# Patient Record
Sex: Female | Born: 1960 | Race: White | Hispanic: No | Smoking: Never smoker
Health system: Southern US, Community
[De-identification: ages and names within clinical notes are randomized; demographics above are authoritative.]

## PROBLEM LIST (undated history)

## (undated) HISTORY — PX: BUNIONECTOMY: SHX129

---

## 1998-02-02 ENCOUNTER — Inpatient Hospital Stay (HOSPITAL_COMMUNITY): Admission: AD | Admit: 1998-02-02 | Discharge: 1998-02-02 | Payer: Self-pay | Admitting: *Deleted

## 1998-04-28 ENCOUNTER — Inpatient Hospital Stay (HOSPITAL_COMMUNITY): Admission: AD | Admit: 1998-04-28 | Discharge: 1998-04-28 | Payer: Self-pay | Admitting: Obstetrics and Gynecology

## 1998-04-30 ENCOUNTER — Inpatient Hospital Stay (HOSPITAL_COMMUNITY): Admission: AD | Admit: 1998-04-30 | Discharge: 1998-05-02 | Payer: Self-pay | Admitting: Obstetrics and Gynecology

## 1998-12-09 ENCOUNTER — Encounter (HOSPITAL_COMMUNITY): Admission: RE | Admit: 1998-12-09 | Discharge: 1999-03-09 | Payer: Self-pay | Admitting: Obstetrics and Gynecology

## 1999-03-12 ENCOUNTER — Encounter (HOSPITAL_COMMUNITY): Admission: RE | Admit: 1999-03-12 | Discharge: 1999-06-10 | Payer: Self-pay | Admitting: Obstetrics and Gynecology

## 2000-08-22 ENCOUNTER — Other Ambulatory Visit: Admission: RE | Admit: 2000-08-22 | Discharge: 2000-08-22 | Payer: Self-pay | Admitting: *Deleted

## 2002-11-02 ENCOUNTER — Other Ambulatory Visit: Admission: RE | Admit: 2002-11-02 | Discharge: 2002-11-02 | Payer: Self-pay | Admitting: Obstetrics and Gynecology

## 2003-05-02 ENCOUNTER — Other Ambulatory Visit: Admission: RE | Admit: 2003-05-02 | Discharge: 2003-05-02 | Payer: Self-pay | Admitting: Obstetrics and Gynecology

## 2003-05-22 ENCOUNTER — Ambulatory Visit (HOSPITAL_COMMUNITY): Admission: RE | Admit: 2003-05-22 | Discharge: 2003-05-22 | Payer: Self-pay | Admitting: Family Medicine

## 2004-06-25 ENCOUNTER — Other Ambulatory Visit: Admission: RE | Admit: 2004-06-25 | Discharge: 2004-06-25 | Payer: Self-pay | Admitting: Obstetrics and Gynecology

## 2005-03-24 ENCOUNTER — Other Ambulatory Visit: Admission: RE | Admit: 2005-03-24 | Discharge: 2005-03-24 | Payer: Self-pay | Admitting: Obstetrics and Gynecology

## 2005-09-16 ENCOUNTER — Other Ambulatory Visit: Admission: RE | Admit: 2005-09-16 | Discharge: 2005-09-16 | Payer: Self-pay | Admitting: Obstetrics and Gynecology

## 2015-03-31 ENCOUNTER — Ambulatory Visit: Payer: Self-pay | Admitting: Podiatry

## 2015-04-01 ENCOUNTER — Encounter: Payer: Self-pay | Admitting: Podiatry

## 2015-04-01 ENCOUNTER — Ambulatory Visit (INDEPENDENT_AMBULATORY_CARE_PROVIDER_SITE_OTHER): Payer: BLUE CROSS/BLUE SHIELD

## 2015-04-01 ENCOUNTER — Ambulatory Visit (INDEPENDENT_AMBULATORY_CARE_PROVIDER_SITE_OTHER): Payer: BLUE CROSS/BLUE SHIELD | Admitting: Podiatry

## 2015-04-01 DIAGNOSIS — M2042 Other hammer toe(s) (acquired), left foot: Secondary | ICD-10-CM

## 2015-04-01 DIAGNOSIS — M2012 Hallux valgus (acquired), left foot: Secondary | ICD-10-CM | POA: Diagnosis not present

## 2015-04-01 NOTE — Progress Notes (Signed)
   Subjective:    Patient ID: Katherine Dunn, female    DOB: April 05, 1960, 55 y.o.   MRN: [id number]  HPI: She presents today with a chief complaint of a painful bunion left foot. She states that she has had it many years and notices that it seems to run in the family. She states that her brother does have his fix and it was a simple process. She is concerned about the second toe overlapping the great toe.    Review of Systems  HENT: Positive for hearing loss.   All other systems reviewed and are negative.      Objective:   Physical Exam: She is a 55 year old female vital signs stable alert and oriented 3 in apparent good health with no acute distress. Pulses are strongly palpable. Neurologic sensorium is intact per Semmes-Weinstein monofilament. Deep tendon reflexes are intact. Muscle strength is 5 over 5 dorsiflexion plantar flexors and inverters everters all intrinsic musculature is intact. Orthopedic evaluation of his roots all joints distal to the ankle for range of motion without crepitation. 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 There is mild tenderness in this area and mild reactive hyperkeratosis plantarly. 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 She does have some spurring and some subchondral eburnation as well. Second toe appears to be more flexible without signs of osteoarthritic change but in elongated second metatarsal is present. Cutaneous evaluation of a straight supple well-hydrated cutis no erythema edema cellulitis drainage or odor.        Assessment & Plan:  Assessment: Hallux abductovalgus deformity left foot with  plantarflexed elongated second metatarsal and contracted second metatarsophalangeal joint with flexible hammertoe deformity.  Plan: We discussed the etiology pathology conservative versus surgical therapies. At this point she would like to have this fixed. Center today for an [facility] bunion repair and a second metatarsal osteotomy with pinning of the second toe possible. I answered all the questions regarding these procedures to the best of my ability in layman's terms. She understands this and is amenable to it. We did discuss the possible postop complications which may include but are not limited to postop pain bleeding swelling infection recurrence need for further surgery loss of digit loss of limb and loss of life.  She signed all the patient's consent form and was referred to [facility] for scheduling. We dispensed a single Cam Walker for her postop recovery. I will follow-up with her in March.

## 2015-04-01 NOTE — Patient Instructions (Signed)
Pre-Operative Instructions  Congratulations, you have decided to take an important step to improving your quality of life.  You can be assured that the doctors of [facility] will be with you every step of the way.  1. Plan to be at the surgery center/hospital at least 1 (one) hour prior to your scheduled time unless otherwise directed by the surgical center/hospital staff.  You must have a responsible adult accompany you, remain during the surgery and drive you home.  Make sure you have directions to the surgical center/hospital and know how to get there on time. 2. For hospital based surgery you will need to obtain a history and physical form from your family physician within 1 month prior to the date of surgery- we will give you a form for you primary physician.  3. We make every effort to accommodate the date you request for surgery.  There are however, times where surgery dates or times have to be moved.  We will contact you as soon as possible if a change in schedule is required.   4. No Aspirin/Ibuprofen for one week before surgery.  If you are on aspirin, any non-steroidal anti-inflammatory medications (Mobic, Aleve, Ibuprofen) you should stop taking it 7 days prior to your surgery.  You make take Tylenol  For pain prior to surgery.  5. Medications- If you are taking daily heart and blood pressure medications, seizure, reflux, allergy, asthma, anxiety, pain or diabetes medications, make sure the surgery center/hospital is aware before the day of surgery so they may notify you which medications to take or avoid the day of surgery. 6. No food or drink after midnight the night before surgery unless directed otherwise by surgical center/hospital staff. 7. No alcoholic beverages 24 hours prior to surgery.  No smoking 24 hours prior to or 24 hours after surgery. 8. Wear loose pants or shorts- loose enough to fit over bandages, boots, and casts. 9. No slip on shoes, sneakers are best. 10. Bring  your boot with you to the surgery center/hospital.  Also bring crutches or a walker if your physician has prescribed it for you.  If you do not have this equipment, it will be provided for you after surgery. 11. If you have not been contracted by the surgery center/hospital by the day before your surgery, call to confirm the date and time of your surgery. 12. Leave-time from work may vary depending on the type of surgery you have.  Appropriate arrangements should be made prior to surgery with your employer. 13. Prescriptions will be provided immediately following surgery by your doctor.  Have these filled as soon as possible after surgery and take the medication as directed. 14. Remove nail polish on the operative foot. 15. Wash the night before surgery.  The night before surgery wash the foot and leg well with the antibacterial soap provided and water paying special attention to beneath the toenails and in between the toes.  Rinse thoroughly with water and dry well with a towel.  Perform this wash unless told not to do so by your physician.  Enclosed: 1 Ice pack (please put in freezer the night before surgery)   1 Hibiclens skin cleaner   Pre-op Instructions  If you have any questions regarding the instructions, do not hesitate to call our office.  [place]: [address] [place], [place] [address] [phone]  [place]: [address] [place], [place] [address] [phone]  [place]: [address]  [place], [place] [address] [phone]  Dr. Richard   Tuchman DPM, Dr. Norman Regal DPM Dr. Richard Sikora DPM, Dr. M. Todd Hyatt DPM, Dr. Kathryn Egerton DPM 

## 2015-04-21 ENCOUNTER — Telehealth: Payer: Self-pay | Admitting: *Deleted

## 2015-04-21 ENCOUNTER — Encounter: Payer: Self-pay | Admitting: Podiatry

## 2015-04-21 NOTE — Telephone Encounter (Addendum)
Patient came by the office and canceled surgery scheduled for 05/02/2015 with front office staff.  I attempted to call her to see if she wanted to reschedule or if there was a problem.  I called and left a message for Aram Beecham at [facility] about the cancellation.

## 2015-04-22 NOTE — Telephone Encounter (Signed)
"  I'm returning Katherine Dunn's call.  I came in Monday to cancel my foot surgery for March 3 with Dr. Al Corpus.  Maybe that's why she is calling me but I'm definitely canceling the surgery.  So, if you need me call me back, thank you."   I left patient a message that I would take her off the books.  Call me if you would like to reschedule.

## 2015-04-28 ENCOUNTER — Telehealth: Payer: Self-pay | Admitting: *Deleted

## 2015-04-28 NOTE — Telephone Encounter (Signed)
"  We've played telephone tag now for almost a week.  I called to cancel my surgery with Dr. Al Corpus which was scheduled for March 3.  I am not rescheduling.  I actually already had the surgery done early last week.  I needed it done as soon as possible and I found an Orthopedist that could do it 2 weeks earlier.  So I went with it!  If you have any questions, give me a call back."  I attempted to return her call.  I left her a message that I got her message.  I'll let Dr. Al Corpus know.  Call if you ever need [place].

## 2015-04-28 NOTE — Telephone Encounter (Signed)
I hope her results are good.  Make sure to remove from schedule and follow up appointments.

## 2015-05-08 ENCOUNTER — Encounter: Payer: Self-pay | Admitting: Podiatry

## 2015-05-15 ENCOUNTER — Encounter: Payer: Self-pay | Admitting: Podiatry

## 2017-01-11 ENCOUNTER — Other Ambulatory Visit: Payer: Self-pay | Admitting: Physician Assistant

## 2017-01-11 ENCOUNTER — Ambulatory Visit
Admission: RE | Admit: 2017-01-11 | Discharge: 2017-01-11 | Disposition: A | Discharge status: Home / Self Care | Payer: BLUE CROSS/BLUE SHIELD | Source: Ambulatory Visit | Attending: Physician Assistant | Admitting: Physician Assistant

## 2017-01-11 DIAGNOSIS — R0781 Pleurodynia: Secondary | ICD-10-CM

## 2017-06-18 ENCOUNTER — Emergency Department (HOSPITAL_COMMUNITY): Payer: Managed Care, Other (non HMO)

## 2017-06-18 ENCOUNTER — Other Ambulatory Visit: Payer: Self-pay

## 2017-06-18 ENCOUNTER — Emergency Department (HOSPITAL_COMMUNITY)
Admission: EM | Admit: 2017-06-18 | Discharge: 2017-06-18 | Disposition: A | Discharge status: Home / Self Care | Payer: Managed Care, Other (non HMO) | Attending: Physician Assistant | Admitting: Physician Assistant

## 2017-06-18 ENCOUNTER — Encounter (HOSPITAL_COMMUNITY): Payer: Self-pay | Admitting: Emergency Medicine

## 2017-06-18 DIAGNOSIS — W11XXXA Fall on and from ladder, initial encounter: Secondary | ICD-10-CM | POA: Insufficient documentation

## 2017-06-18 DIAGNOSIS — Y929 Unspecified place or not applicable: Secondary | ICD-10-CM | POA: Insufficient documentation

## 2017-06-18 DIAGNOSIS — S92001A Unspecified fracture of right calcaneus, initial encounter for closed fracture: Secondary | ICD-10-CM | POA: Insufficient documentation

## 2017-06-18 DIAGNOSIS — Y9389 Activity, other specified: Secondary | ICD-10-CM | POA: Diagnosis not present

## 2017-06-18 DIAGNOSIS — Y999 Unspecified external cause status: Secondary | ICD-10-CM | POA: Diagnosis not present

## 2017-06-18 DIAGNOSIS — S99911A Unspecified injury of right ankle, initial encounter: Secondary | ICD-10-CM | POA: Diagnosis present

## 2017-06-18 MED ORDER — IBUPROFEN 400 MG PO TABS
400.0000 mg | ORAL_TABLET | Freq: Once | ORAL | Status: AC
  Administered 2017-06-18: 400 mg via ORAL
  Filled 2017-06-18: qty 1

## 2017-06-18 MED ORDER — HYDROCODONE-ACETAMINOPHEN 5-325 MG PO TABS
1.0000 | ORAL_TABLET | Freq: Once | ORAL | Status: DC
  Filled 2017-06-18: qty 1

## 2017-06-18 MED ORDER — HYDROCODONE-ACETAMINOPHEN 5-325 MG PO TABS: 1.0000 | ORAL_TABLET | Freq: Four times a day (QID) | ORAL | 0 refills | Status: AC | PRN

## 2017-06-18 NOTE — ED Provider Notes (Signed)
[facility] EMERGENCY DEPARTMENT Provider Note   CSN: [id number] Arrival date & time: 06/18/17  1318     History   Chief Complaint Chief Complaint  Patient presents with  . Ankle Pain    HPI Katherine Dunn is a 57 y.o. female who presents for evaluation of right ankle/foot pain that began today after mechanical fall.  Patient reports that she was cleaning the gutters and states that she went back down to go down the ladder which fell, causing her to fall approximately 10 feet and landed feet first on a brick patio.  Patient reports that she did not hit her head or lose consciousness.  She reports that she was able to recall the entire event.  Patient reports she is not currently on blood thinners.  She has not had any vomiting since the incident.  Patient reports that she was unable to bear weight on the right lower leg since the incident.  She reports that she had to crawl on the ground to get to the house.  Patient states she has not taken anything for the pain.  Patient denies any vision changes, neck pain, chest pain, difficulty breathing, abdominal pain, numbness/weakness of her extremities.  The history is provided by the patient.    History reviewed. No pertinent past medical history.  There are no active problems to display for this patient.   Past Surgical History:  Procedure Laterality Date  . BUNIONECTOMY       OB History   None      Home Medications    Prior to Admission medications   Medication Sig Start Date End Date Taking? Authorizing Provider  HYDROcodone-acetaminophen (NORCO/VICODIN) 5-325 MG tablet Take 1-2 tablets by mouth every 6 (six) hours as needed for up to 3 days. 06/18/17 06/21/17  Maxwell Caul, PA-C  valACYclovir (VALTREX) 500 MG tablet Take 500 mg by mouth daily. 12/31/14   [provider]    Family History History reviewed. No pertinent family history.  Social History Social History   Tobacco Use  .  Smoking status: Never Smoker  . Smokeless tobacco: Never Used  Substance Use Topics  . Alcohol use: Yes    Alcohol/week: 0.0 oz  . Drug use: Not on file     Allergies   Patient has no known allergies.   Review of Systems Review of Systems  Eyes: Negative for visual disturbance.  Respiratory: Negative for cough and shortness of breath.   Cardiovascular: Negative for chest pain.  Gastrointestinal: Negative for abdominal pain, nausea and vomiting.  Musculoskeletal: Negative for back pain and neck pain.       Right foot pain  Neurological: Negative for weakness, numbness and headaches.  All other systems reviewed and are negative.    Physical Exam Updated Vital Signs BP 114/72 (BP Location: Right Arm)   Pulse 83   Temp 98.7 F (37.1 C) (Oral)   Resp 18   SpO2 100%   Physical Exam  Constitutional: She is oriented to person, place, and time. She appears well-developed and well-nourished.  HENT:  Head: Normocephalic and atraumatic.  Right Ear: Tympanic membrane normal. No hemotympanum.  Left Ear: Tympanic membrane normal. No hemotympanum.  Mouth/Throat: Oropharynx is clear and moist and mucous membranes are normal.  No tenderness to palpation of skull. No deformities or crepitus noted. No open wounds, abrasions or lacerations.   Eyes: Pupils are equal, round, and reactive to light. Conjunctivae, EOM and lids are normal. Right eye exhibits no  discharge. Left eye exhibits no discharge. No scleral icterus.  Neck: Full passive range of motion without pain.  Full flexion/extension and lateral movement of neck fully intact. No bony midline tenderness. No deformities or crepitus.   Cardiovascular: Normal rate, regular rhythm, normal heart sounds and normal pulses. Exam reveals no gallop and no friction rub.  No murmur heard. Pulses:      Dorsalis pedis pulses are 2+ on the right side, and 2+ on the left side.  Pulmonary/Chest: Effort normal and breath sounds normal.  No evidence  of respiratory distress. Able to speak in full sentences without difficulty.  No tenderness palpation noted to anterior chest wall.  No deformity or crepitus noted.  Abdominal: Soft. Normal appearance. There is no tenderness. There is no rigidity and no guarding.  Musculoskeletal: Normal range of motion.       Thoracic back: She exhibits no tenderness.       Lumbar back: She exhibits no tenderness.  No midline tenderness noted to T or L-spine.  No deformities or crepitus noted. No tenderness to palpation to bilateral shoulders, clavicles, elbows, and wrists. No deformities or crepitus noted. FROM of BUE without difficulty.  Tenderness palpation noted to the right foot, particularly the right heel.  No overlying deformity or crepitus noted.  No tenderness palpation to proximal tib-fib, knee.  No abnormalities of the left lower extremity.  Neurological: She is alert and oriented to person, place, and time.  Follows commands, Moves all extremities  5/5 strength to BUE and BLE  Sensation intact throughout all major nerve distributions  Skin: Skin is warm and dry. Capillary refill takes less than 2 seconds.  Good distal cap refill. RLE is not dusky in appearance or cool to touch.  Scattered abrasions noted to the anterior aspect of the right knee.  Psychiatric: She has a normal mood and affect. Her speech is normal and behavior is normal.  Nursing note and vitals reviewed.    ED Treatments / Results  Labs (all labs ordered are listed, but only abnormal results are displayed) Labs Reviewed - No data to display  EKG None  Radiology Dg Lumbar Spine Complete  Result Date: 06/18/2017 CLINICAL DATA:  Fall from ladder with low back pain, initial encounter EXAM: LUMBAR SPINE - COMPLETE 4+ VIEW COMPARISON:  None. FINDINGS: Five lumbar type vertebral bodies are well visualized. Mild scoliosis concave to the right is noted centered at the L2-3 interspace. No pars defects are seen. Facet hypertrophic  changes are noted. Mild degenerative anterolisthesis of L3 on L4 is noted. Disc space narrowing at L5-S1 is seen. No soft tissue abnormality is noted aside from some retained fecal material likely representing mild constipation. Multiple calcified splenic granulomas are seen. IMPRESSION: Degenerative change as described above. Electronically Signed   By: Alcide Clever M.D.   On: 06/18/2017 16:04   Dg Ankle Complete Right  Result Date: 06/18/2017 CLINICAL DATA:  Right ankle pain due to a fall off a roof today. Initial encounter. EXAM: RIGHT ANKLE - COMPLETE 3+ VIEW COMPARISON:  None. FINDINGS: The patient has an acute fracture of the calcaneus. Main fracture line extends from the superior aspect of the calcaneus posterior to the subtalar joint in an anterior and inferior orientation through the mid to posterior body of the calcaneus. The fracture appears to have a component extending through the posterior facet of the subtalar joint. No other fracture is identified. No dislocation. IMPRESSION: Acute calcaneus fracture as described above. Electronically Signed   By: Maisie Fus  Dalessio M.D.   On: 06/18/2017 14:20    Procedures Procedures (including critical care time)  Medications Ordered in ED Medications  HYDROcodone-acetaminophen (NORCO/VICODIN) 5-325 MG per tablet 1 tablet (1 tablet Oral Refused 06/18/17 1707)  ibuprofen (ADVIL,MOTRIN) tablet 400 mg (400 mg Oral Given 06/18/17 1330)     Initial Impression / Assessment and Plan / ED Course  I have reviewed the triage vital signs and the nursing notes.  Pertinent labs & imaging results that were available during my care of the patient were reviewed by me and considered in my medical decision making (see chart for details).     57 year old female who presents for evaluation of right foot pain after mechanical fall that occurred earlier today.  Patient reports that she was cleaning out the gutters and went back to stop water missed, causing her to  fall down approximately 10 feet.  Patient reports she landed on her foot.  She reports she did not hit her head or lose consciousness.  She is not currently on blood thinners.  Patient is not able to ambulate or bear weight on the foot since the incident. Patient is afebrile, non-toxic appearing, sitting comfortably on examination table. Vital signs reviewed and stable.  Tenderness palpation of the right foot.  There is some overlying soft tissue swelling.  No deformities or crepitus noted.  No tenderness palpation to proximal tib-fib, right knee.  No abnormalities of left lower extremity.  Patient with no midline C, T, L-spine tenderness.  No tenderness palpation the anterior chest wall.  Good strength of bilateral upper extremities. Given reassuring physical exam and [facility] criteria, no imaging is indicated at this time. Given reassuring exam and NEXUS criteria, no indication for cervical imaging at this time.  Initial x-rays ordered at triage for foot for consideration of sprain versus fracture versus dislocation.  X-rays reviewed.  Patient has a calcaneal fracture of the right foot.  Given calcaneal fracture and mechanism of injury, will plan for L-spine imaging.  Patient does not have any midline tenderness of the L-spine.  We will plan for this x-ray evaluation.  X-rays of L-spine reviewed.  Negative for any acute fracture dislocation.  Discussed with Dr. Magnus IvanBlackman (Ortho).  He recommends a splint with posterior padding to help with swelling.  Recommend elevation, pain control.  We will plan to see patient in outpatient office this week.  Recommend her calling his office on Monday for further evaluation.  Discussed results with patient.  Reevaluation after splint placement.  Patient has good distal cap refill.  Discussed splint care precautions with patient.  Compartment syndrome precautions discussed with patient.  Instructed patient to follow-up with Dr. Eliberto IvoryBlackman's office on Monday as  directed. Patient had ample opportunity for questions and discussion. All patient's questions were answered with full understanding. Strict return precautions discussed. Patient expresses understanding and agreement to plan.   Final Clinical Impressions(s) / ED Diagnoses   Final diagnoses:  Closed nondisplaced fracture of right calcaneus, unspecified portion of calcaneus, initial encounter    ED Discharge Orders        Ordered    HYDROcodone-acetaminophen (NORCO/VICODIN) 5-325 MG tablet  Every 6 hours PRN     06/18/17 1636       Maxwell CaulLayden, Lindsey A, PA-C 06/18/17 1747    Mackuen, Cindee Saltourteney Lyn, MD 06/18/17 2340

## 2017-06-18 NOTE — Progress Notes (Signed)
Orthopedic Tech Progress Note Patient Details:  Katherine EarthlyLorin Dunn 02/14/1961 [id number]  Ortho Devices Type of Ortho Device: Short leg splint, Ace wrap Ortho Device/Splint Interventions: Application   Post Interventions Patient Tolerated: Well Instructions Provided: Care of device   Katherine FordyceJennifer C Airelle Everding 06/18/2017, 4:51 PM

## 2017-06-18 NOTE — Discharge Instructions (Addendum)
You can take Tylenol 1000 mg 3 times a day for pain. You can take the pain medication for severe breakthrough pain.  Do not exceed 4000 mg of Tylenol a day.  As we discussed, keep the leg elevated.  Propped up on pillows above heart level to help with swelling.  Follow-up with referred orthopedic doctor.  Call their office on Monday and they will arrange for an appointment for you on outpatient basis.  Return to the emergency department for any worsening pain, swelling, discoloration of the toes, swelling of the leg, fevers, numbness/weakness of your extremities, vision changes, chest pain, difficulty breathing, or any other worsening or concerning symptoms.

## 2017-06-18 NOTE — ED Notes (Signed)
Declined W/C at D/C and was escorted to lobby by RN. 

## 2017-06-18 NOTE — ED Triage Notes (Signed)
Patient presents to ED for assessment of rink ankle/foot pain after falling straight down off of a ladder while cleaning the gutters (approx 10 feet, landing on her right ankle).

## 2017-06-20 ENCOUNTER — Telehealth (INDEPENDENT_AMBULATORY_CARE_PROVIDER_SITE_OTHER): Payer: Self-pay | Admitting: *Deleted

## 2017-06-20 NOTE — Telephone Encounter (Signed)
Pt called left message on Triage vm stating she is needing a follow up from ED this week for a broken heel. I called pt back and left message to return my call so I can get her scheduled.

## 2017-06-23 ENCOUNTER — Ambulatory Visit (INDEPENDENT_AMBULATORY_CARE_PROVIDER_SITE_OTHER): Payer: Managed Care, Other (non HMO) | Admitting: Orthopaedic Surgery

## 2017-06-23 ENCOUNTER — Other Ambulatory Visit (INDEPENDENT_AMBULATORY_CARE_PROVIDER_SITE_OTHER): Payer: Self-pay

## 2017-06-23 ENCOUNTER — Encounter (INDEPENDENT_AMBULATORY_CARE_PROVIDER_SITE_OTHER): Payer: Self-pay | Admitting: Orthopaedic Surgery

## 2017-06-23 DIAGNOSIS — S92011A Displaced fracture of body of right calcaneus, initial encounter for closed fracture: Secondary | ICD-10-CM | POA: Diagnosis not present

## 2017-06-23 DIAGNOSIS — S92001A Unspecified fracture of right calcaneus, initial encounter for closed fracture: Secondary | ICD-10-CM

## 2017-06-23 NOTE — Progress Notes (Addendum)
Office Visit Note   Patient: Katherine Dunn           Date of Birth: 12/09/1960           MRN: [id number] Visit Date: 06/23/2017              Requested by: No referring provider defined for this encounter. PCP: Patient, [name]   Assessment & Plan: Visit Diagnoses:  1. Closed displaced fracture of body of right calcaneus, initial encounter     Plan: She will remain nonweightbearing until further notice.  We will release put her in a cam walker boot so she can come out of the boot for ice and elevation as well as hygiene purposes.  She understands that I am fine with removing her ankle but not putting any weight on her right foot at all.  I do want to obtain a CT scan of the foot to assess the subtalar joint and the calcaneus in 3 dimensions so we need a better idea of what the treatment options may be but hopefully this will be nonoperative treatment recommendation.  We will see her back next week to go to CT scan and reassess her foot.  All questions concerns were answered and addressed.  Follow-Up Instructions: Return in about 6 days (around 06/29/2017).   Orders:  No orders of the defined types were placed in this encounter.  No orders of the defined types were placed in this encounter.     Procedures: No procedures performed   Clinical Data: No additional findings.   Subjective: Chief Complaint  Patient presents with  . Right Foot - Pain  The patient is someone I am seeing for the first time as a referral from the emergency room.  She is 57 years old and is very healthy individual.  She fell about 10 feet off a roof she was working on landing directly on her right heel.  She was seen in the emergency room and found to have a calcaneus fracture.  She is placed in a well-padded splint and has been on crutches and nonweightbearing.  She reports moderate pain does not really need to take anything for pain.  She is very active individual who does not take any medications and  has no active medical issues at all.  She is not a smoker and is not a diabetic and is a highly motivated individual.   HPI  Review of Systems She currently denies any headache, chest pain, short of breath, fever, chills, nausea, vomiting.  Objective: Vital Signs: There were no vitals taken for this visit.  Physical Exam She is alert and oriented x3 and in no acute distress Ortho Exam Examination of her right foot shows significant swelling all about her forefoot midfoot and hindfoot.  There is significant bruising as well.  She has normal sensation of her foot and nice palpable pulse she is able to flex and extend at the ankle and move her toes. Specialty Comments:  No specialty comments available.  Imaging: No results found. X-rays on the canopy system show a calcaneus body fracture on the right side.  These are independently reviewed.  This does appear to affect the subtalar joint.  There is not significant flattening or widening of the calcaneus on plain film.  There is not a significant loss in Boehler's angle.  PMFS History: There are no active problems to display for this patient.  History reviewed. No pertinent past medical history.  History reviewed. No pertinent  family history.  Past Surgical History:  Procedure Laterality Date  . BUNIONECTOMY     Social History   Occupational History  . Not on file  Tobacco Use  . Smoking status: Never Smoker  . Smokeless tobacco: Never Used  Substance and Sexual Activity  . Alcohol use: Yes    Alcohol/week: 0.0 oz  . Drug use: Not on file  . Sexual activity: Not on file

## 2017-06-24 ENCOUNTER — Telehealth (INDEPENDENT_AMBULATORY_CARE_PROVIDER_SITE_OTHER): Payer: Self-pay | Admitting: Orthopaedic Surgery

## 2017-06-24 NOTE — Telephone Encounter (Signed)
Yes.  She does not need to wear the boot in bed.

## 2017-06-24 NOTE — Telephone Encounter (Signed)
Patient called asked if she can remove the boot at night? The number to contact patient is [phone]

## 2017-06-24 NOTE — Telephone Encounter (Signed)
Patient aware she can remove at night

## 2017-06-24 NOTE — Telephone Encounter (Signed)
Please advise 

## 2017-06-29 ENCOUNTER — Ambulatory Visit
Admission: RE | Admit: 2017-06-29 | Discharge: 2017-06-29 | Disposition: A | Discharge status: Home / Self Care | Payer: Managed Care, Other (non HMO) | Source: Ambulatory Visit | Attending: Orthopaedic Surgery | Admitting: Orthopaedic Surgery

## 2017-06-29 DIAGNOSIS — S92001A Unspecified fracture of right calcaneus, initial encounter for closed fracture: Secondary | ICD-10-CM

## 2017-07-06 ENCOUNTER — Encounter (INDEPENDENT_AMBULATORY_CARE_PROVIDER_SITE_OTHER): Payer: Self-pay | Admitting: Orthopaedic Surgery

## 2017-07-06 ENCOUNTER — Ambulatory Visit (INDEPENDENT_AMBULATORY_CARE_PROVIDER_SITE_OTHER): Payer: Managed Care, Other (non HMO) | Admitting: Orthopaedic Surgery

## 2017-07-06 DIAGNOSIS — S92001A Unspecified fracture of right calcaneus, initial encounter for closed fracture: Secondary | ICD-10-CM | POA: Insufficient documentation

## 2017-07-06 NOTE — Progress Notes (Signed)
The patient is here for follow-up to go over CT scan of her right calcaneus.  She sustained a calcaneus fracture all after having a significant mechanical fall from a height.  She is been in a cam walking boot and been compliant with nonweightbearing.  On exam her ankles swollen and bruised but overall is well located and neurovascular intact.  This is on the right side.  She has pain to be expected over the calcaneus to palpation.  CT scan is reviewed with her and does show a comminuted calcaneus fracture that does involve the subtalar joint but the step-off is very minimal and essentially it was nondisplaced in terms of the articular surface.  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  I would like to see her back in 4 weeks with a Harris heel view/calcaneus view and a lateral view of the right foot.

## 2017-08-03 ENCOUNTER — Ambulatory Visit (INDEPENDENT_AMBULATORY_CARE_PROVIDER_SITE_OTHER): Payer: Managed Care, Other (non HMO)

## 2017-08-03 ENCOUNTER — Ambulatory Visit (INDEPENDENT_AMBULATORY_CARE_PROVIDER_SITE_OTHER): Payer: Managed Care, Other (non HMO) | Admitting: Orthopaedic Surgery

## 2017-08-03 ENCOUNTER — Encounter (INDEPENDENT_AMBULATORY_CARE_PROVIDER_SITE_OTHER): Payer: Self-pay | Admitting: Orthopaedic Surgery

## 2017-08-03 ENCOUNTER — Telehealth (INDEPENDENT_AMBULATORY_CARE_PROVIDER_SITE_OTHER): Payer: Self-pay | Admitting: Orthopaedic Surgery

## 2017-08-03 DIAGNOSIS — M79671 Pain in right foot: Secondary | ICD-10-CM

## 2017-08-03 DIAGNOSIS — S92001D Unspecified fracture of right calcaneus, subsequent encounter for fracture with routine healing: Secondary | ICD-10-CM

## 2017-08-03 NOTE — Telephone Encounter (Signed)
IC patient and advised.  

## 2017-08-03 NOTE — Telephone Encounter (Signed)
I am fine with her driving. 

## 2017-08-03 NOTE — Telephone Encounter (Signed)
Please advise on driving?  Or when she can?  I see she is only 50% weight bearing in boot.

## 2017-08-03 NOTE — Progress Notes (Signed)
The patient is a very pleasant 57 year old female who is now about 6 weeks into the significant right calcaneus fracture.  She has been compliant with nonweightbearing in a cam walking boot.  She still experiencing swelling to be expected at this time.  She has some soreness in her foot as well.  On exam she still has some bruising on the plantar aspect and lateral swelling but overall clinically she looks better.  X-rays also confirm continued consolidation of a comminuted calcaneus body fracture of the right calcaneus.  At this point will letter 50% weightbearing in her cam walking boot.  We will see her back in 4 weeks with a final lateral and Harris heel/calcaneus view of the right foot.  At that point hopefully we will let her full weightbearing as tolerated.  All questions concerns were answered and addressed.

## 2017-08-03 NOTE — Telephone Encounter (Signed)
Patient wanted to know if she can drive and if so what does she need to do.

## 2017-08-31 ENCOUNTER — Ambulatory Visit (INDEPENDENT_AMBULATORY_CARE_PROVIDER_SITE_OTHER): Payer: Managed Care, Other (non HMO) | Admitting: Orthopaedic Surgery

## 2017-08-31 ENCOUNTER — Ambulatory Visit (INDEPENDENT_AMBULATORY_CARE_PROVIDER_SITE_OTHER): Payer: Managed Care, Other (non HMO)

## 2017-08-31 ENCOUNTER — Encounter (INDEPENDENT_AMBULATORY_CARE_PROVIDER_SITE_OTHER): Payer: Self-pay | Admitting: Orthopaedic Surgery

## 2017-08-31 DIAGNOSIS — S92001D Unspecified fracture of right calcaneus, subsequent encounter for fracture with routine healing: Secondary | ICD-10-CM | POA: Diagnosis not present

## 2017-08-31 NOTE — Progress Notes (Signed)
The patient is a very healthy 57 year old female who is not a diabetic and not a smoker.  She is now 10 weeks status post a significant right calcaneus fracture.  There was slight widening of the calcaneus and slight shortening.  There is only slight congruence of the subtalar joint at the posterior facet.  She is been in a cam walking boot with 50% weightbearing.  Her pain is minimal.  On exam she has good motion at the tibiotalar joint of the right ankle as well as subtalar joint with minimal discomfort and pain.  There is some lateral swelling but overall her foot is neurovascular intact looks good.  X-rays are obtained today with 2 views of the calcaneus compared to previous films and show the fracture is healed with not significant displacement.  She understands the risks of subtalar arthritis.  We will letter weight-bear as tolerated this point he can transition out of the cam walker as she is comfortable.  All question concerns were answered and addressed.  At this point follow-up will be as needed.  She knows to hold off on high impact aerobic activities for at least another 3 months.

## 2017-09-14 ENCOUNTER — Telehealth (INDEPENDENT_AMBULATORY_CARE_PROVIDER_SITE_OTHER): Payer: Self-pay | Admitting: Orthopaedic Surgery

## 2017-09-14 ENCOUNTER — Other Ambulatory Visit (INDEPENDENT_AMBULATORY_CARE_PROVIDER_SITE_OTHER): Payer: Self-pay

## 2017-09-14 DIAGNOSIS — M79671 Pain in right foot: Secondary | ICD-10-CM

## 2017-09-14 DIAGNOSIS — S92001D Unspecified fracture of right calcaneus, subsequent encounter for fracture with routine healing: Secondary | ICD-10-CM

## 2017-09-14 NOTE — Telephone Encounter (Signed)
Patient is asking for PT but is unsure of where to go. She went to [facility] in the past for this. Do you have a recommendation for a place for her specific problem (that is maybe near the [facility] or [place] area?  Also, any specific instructions for PT?

## 2017-09-14 NOTE — Telephone Encounter (Signed)
Patient called advised she need an order for (PT)  Patient is asking for 4 to 6 sessions. The number to contact patient is [phone]

## 2017-09-14 NOTE — Telephone Encounter (Signed)
I am fine with her seeking outpatient physical therapy to help strengthen her foot and ankle having had a calcaneus fracture.  The therapist can work on any modalities to decrease her swelling and decrease her pain.  There are no restrictions for her from a therapy standpoint either.  If she [facility] that is fine.  Another option would be [facility] [place].

## 2017-09-14 NOTE — Telephone Encounter (Signed)
Spoke with patient.  Order sent for PT at [facility].

## 2017-09-22 ENCOUNTER — Other Ambulatory Visit: Payer: Self-pay

## 2017-09-22 ENCOUNTER — Encounter: Payer: Self-pay | Admitting: Physical Therapy

## 2017-09-22 ENCOUNTER — Ambulatory Visit: Payer: Managed Care, Other (non HMO) | Attending: Orthopaedic Surgery | Admitting: Physical Therapy

## 2017-09-22 DIAGNOSIS — R600 Localized edema: Secondary | ICD-10-CM | POA: Diagnosis present

## 2017-09-22 DIAGNOSIS — M25671 Stiffness of right ankle, not elsewhere classified: Secondary | ICD-10-CM | POA: Diagnosis present

## 2017-09-22 DIAGNOSIS — M6281 Muscle weakness (generalized): Secondary | ICD-10-CM | POA: Diagnosis present

## 2017-09-22 DIAGNOSIS — R262 Difficulty in walking, not elsewhere classified: Secondary | ICD-10-CM | POA: Diagnosis present

## 2017-09-22 DIAGNOSIS — M25571 Pain in right ankle and joints of right foot: Secondary | ICD-10-CM | POA: Insufficient documentation

## 2017-09-22 NOTE — Therapy (Signed)
[facility] [address], [address] [place], [place], [address] Phone: [phone]   Fax:  [phone]  Physical Therapy Evaluation  Patient Details  Name: Katherine Dunn MRN: [id number] Date of Birth: Aug 04, 1960 Referring Provider: Dr. Magnus Ivan    Encounter Date: 09/22/2017  PT End of Session - 09/22/17 2117    Visit Number  1    Date for PT Re-Evaluation  11/17/17    PT Start Time  1100    PT Stop Time  1151    PT Time Calculation (min)  51 min    Activity Tolerance  Patient tolerated treatment well       History reviewed. No pertinent past medical history.  Past Surgical History:  Procedure Laterality Date  . BUNIONECTOMY      There were no vitals filed for this visit.   Subjective Assessment - 09/22/17 1104    Subjective  Fell off roof into patio April 19th fracturing right calcaneus NWB with crutches.  On July 3rd, allowed to weight bear.  Used boot 1 week.  Lateral ankle pain and Achilles region pain.  Mild ankle swelling.      Pertinent History  bunionectomy left ;  former gymnast    Limitations  Walking;House hold activities    How long can you walk comfortably?  never comfortable    Diagnostic tests  x-rays recently     Patient Stated Goals  get my gait back without pain;  I'm a power walker with 2 dogs 3 1/2 miles daily     Currently in Pain?  Yes    Pain Location  Ankle    Pain Orientation  Right    Pain Type  Chronic pain    Pain Onset  More than a month ago    Pain Frequency  Constant    Aggravating Factors   walking     Pain Relieving Factors  ice during NWB phase         [facility] PT Assessment - 09/22/17 0001      Assessment   Medical Diagnosis  right calcaneal fracture; pain in right foot    Referring Provider  Dr. Magnus Ivan     Onset Date/Surgical Date  06/17/17    Next MD Visit  as needed    Prior Therapy  for left bunionectomy      Precautions   Precautions  None      Restrictions    Weight Bearing Restrictions  No      Balance Screen   Has the patient fallen in the past 6 months  Yes    How many times?  1x causing this injury     Has the patient had a decrease in activity level because of a fear of falling?   No    Is the patient reluctant to leave their home because of a fear of falling?   No      Home Environment   Living Environment  Private residence    Home Access  Level entry    Home Layout  Two level;Laundry or work area in basement    Alternate Teacher, music of Steps  12      Prior Function   Level of Independence  Independent    Vocation  Part time employment    Vocation Requirements  travel     Leisure  power walk, traveling      Observation/Other Assessments   Focus on Therapeutic Outcomes (FOTO)   52% limitation  Observation/Other Assessments-Edema    Edema  -- 46 cm fig 8 left; 49 cm right;  21 cm above 33 1/2, 32 right      Posture/Postural Control   Posture Comments  visible calf atrophy; puffiness around right lateral malleoli      AROM   Right Ankle Dorsiflexion  2    Right Ankle Plantar Flexion  50    Right Ankle Inversion  6    Right Ankle Eversion  20    Left Ankle Dorsiflexion  5    Left Ankle Plantar Flexion  60    Left Ankle Inversion  43    Left Ankle Eversion  8      Strength   Strength Assessment Site  -- hip and thigh weakness secondary to period of immobilization    Right Ankle Dorsiflexion  3/5    Right Ankle Plantar Flexion  3/5    Right Ankle Inversion  3-/5    Right Ankle Eversion  3-/5      Flexibility   Soft Tissue Assessment /Muscle Length  yes decreased gastroc muscle length       Ambulation/Gait   Assistive device  -- no device    Stairs  -- ascends reciprocally, descends one at a time    Gait Comments  decreased stance time on right; decreased heel strike                 Objective measurements completed on examination: See above findings.              PT Education -  09/22/17 1201    Education Details   Access Code: [email or web address] yellow band ankle DF, PF, Inversion, eversion;  sidelying and prone leg lifts ;  plantar fascia stretch ; heel cord stretch     Person(s) Educated  Patient    Methods  Explanation;Demonstration;Handout    Comprehension  Returned demonstration;Verbalized understanding       PT Short Term Goals - 09/22/17 2128      PT SHORT TERM GOAL #1   Title  The patient will express knowledge of basic self care for edema management, basic ROM and low level strengthening    Time  4    Period  Weeks    Status  New    Target Date  10/20/17      PT SHORT TERM GOAL #2   Title  The patient will report an overall improvement of 30% in ankle pain with walking, descending stairs for work travel    Time  4    Period  Weeks    Status  New      PT SHORT TERM GOAL #3   Title  The patient will have improved ankle dorsiflexion to 5 degrees needed for improved heel strike with gait     Time  4    Period  Weeks    Status  New      PT SHORT TERM GOAL #4   Title  Improved ankle inversion to 10 degrees and eversion to 25 degrees needed for gait on uneven terrain    Time  4    Period  Weeks    Status  New      PT SHORT TERM GOAL #5   Title  Circumferential measurements within 1-2 cm of left LE indicating decreased edema and atrophy    Time  4    Period  Weeks    Status  New  PT Long Term Goals - 09/22/17 2133      PT LONG TERM GOAL #1   Title  The patient will be independent with safe self progresssion of HEP    Time  8    Period  Weeks    Status  New    Target Date  11/17/17      PT LONG TERM GOAL #2   Title  The patient will have improved right ankle/foot pain by 60% with walking and descending stairs     Time  8    Period  Weeks    Status  New      PT LONG TERM GOAL #3   Title  The patient will have improved gastroc muscle length to 8 degrees needed to descend steps reciprocally    Time  8    Period  Weeks    Status  New       PT LONG TERM GOAL #4   Title  The patient will be able to walk 1 mile with minimal ankle pain.     Time  8    Period  Weeks    Status  New      PT LONG TERM GOAL #5   Title  Right ankle/foot strength grossly 4-/5 needed for walking longer periods of time    Time  8    Period  Weeks    Status  New      Additional Long Term Goals   Additional Long Term Goals  Yes      PT LONG TERM GOAL #6   Title  FOTO functional outcome score improved from 52% limitation to 31%    Time  8    Period  Weeks    Status  New             Plan - 09/22/17 1201    Clinical Impression Statement  Larey SeatFell off roof into patio April 19th fracturing right calcaneus NWB with crutches.  No surgery performed.  On July 3rd, she was allowed to begin weight bearing.  Used boot 1 week.  Lateral ankle pain and Achilles region pain persists.   Mild ankle swelling with 2-3 cm greater with circumferential measurements on right.  Visible calf atrophy.  Mild to moderate limp with decreased right stance time and heel strike.  Decreased ankle ROM in all planes.  Decreased ankle/foot strength to 3-/5 to 3/5 throughout.  She is unable walk any distance without moderate lateral ankle pain.  She is able to ascend stairs reciprocally but one at a time descending.  She would benefit from PT to address these deficits.      History and Personal Factors relevant to plan of care:  lack of comorbidities;  good psychosocial support    Clinical Presentation  Stable    Clinical Decision Making  Low    Rehab Potential  Good    Clinical Impairments Affecting Rehab Potential  none    PT Frequency  2x / week    PT Duration  8 weeks    PT Treatment/Interventions  ADLs/Self Care Home Management;Cryotherapy;Electrical Stimulation;Ultrasound;Moist Heat;Gait training;Stair training;Therapeutic activities;Therapeutic exercise;Patient/family education;Neuromuscular re-education;Manual techniques;Dry needling;Taping;Vasopneumatic Device    PT Next  Visit Plan  review ankle yellow band as needed;  seated rocker board, BAPS, gastroc stretch ; vaso as needed     PT Home Exercise Plan   Access Code: [email or web address]     Consulted and Agree with Plan of Care  Patient  Patient will benefit from skilled therapeutic intervention in order to improve the following deficits and impairments:  Pain, Increased edema, Difficulty walking, Decreased range of motion, Decreased strength, Hypomobility, Impaired perceived functional ability, Decreased activity tolerance  Visit Diagnosis: Pain in right ankle and joints of right foot - Plan: PT plan of care cert/re-cert  Stiffness of right ankle, not elsewhere classified - Plan: PT plan of care cert/re-cert  Muscle weakness (generalized) - Plan: PT plan of care cert/re-cert  Difficulty in walking, not elsewhere classified - Plan: PT plan of care cert/re-cert  Localized edema - Plan: PT plan of care cert/re-cert     Problem List Patient Active Problem List   Diagnosis Date Noted  . Closed nondisplaced fracture of right calcaneus with routine healing 08/03/2017  . Closed nondisplaced fracture of right calcaneus 07/06/2017   Lavinia Sharps, PT 09/22/17 9:40 PM Phone: [phone] Fax: [phone]  Vivien Presto 09/22/2017, 9:40 PM  [facility] [address], [address] [place], [place], [address] Phone: [phone]   Fax:  [phone]  Name: Katherine Dunn MRN: [id number] Date of Birth: 1960-10-17

## 2017-10-03 ENCOUNTER — Ambulatory Visit: Payer: Managed Care, Other (non HMO) | Attending: Orthopaedic Surgery

## 2017-10-03 DIAGNOSIS — M25671 Stiffness of right ankle, not elsewhere classified: Secondary | ICD-10-CM | POA: Insufficient documentation

## 2017-10-03 DIAGNOSIS — M25571 Pain in right ankle and joints of right foot: Secondary | ICD-10-CM | POA: Insufficient documentation

## 2017-10-03 DIAGNOSIS — R600 Localized edema: Secondary | ICD-10-CM

## 2017-10-03 DIAGNOSIS — M6281 Muscle weakness (generalized): Secondary | ICD-10-CM | POA: Diagnosis present

## 2017-10-03 DIAGNOSIS — R262 Difficulty in walking, not elsewhere classified: Secondary | ICD-10-CM | POA: Insufficient documentation

## 2017-10-03 NOTE — Therapy (Signed)
[facility] [address] [place], [place], [address] Phone: [phone]   Fax:  [phone]  Physical Therapy Treatment  Patient Details  Name: Katherine Dunn MRN: [id number] Date of Birth: [birth year] Referring Provider: Dr. Magnus Ivan    Encounter Date: 10/03/2017  PT End of Session - 10/03/17 1008    Visit Number  2    Date for PT Re-Evaluation  11/17/17    Authorization Type  [employer]- no visit limit    PT Start Time  0936    PT Stop Time  1023    PT Time Calculation (min)  47 min    Activity Tolerance  Patient tolerated treatment well    Behavior During Therapy  [facility] for tasks assessed/performed       History reviewed. No pertinent past medical history.  Past Surgical History:  Procedure Laterality Date  . BUNIONECTOMY      There were no vitals filed for this visit.  Subjective Assessment - 10/03/17 0939    Subjective  I walk better in the morning.      Pertinent History  bunionectomy left ;  former gymnast    Patient Stated Goals  get my gait back without pain;  I'm a power walker with 2 dogs 3 1/2 miles daily     Currently in Pain?  Yes    Pain Score  6     Pain Location  Ankle    Pain Orientation  Right    Pain Descriptors / Indicators  Sharp;Tightness;Sore    Pain Type  Chronic pain    Pain Onset  More than a month ago    Pain Frequency  Constant    Aggravating Factors   walking, standing    Pain Relieving Factors  ice, not standing                       [facility] Adult PT Treatment/Exercise - 10/03/17 0001      Exercises   Exercises  Knee/Hip;Ankle      Ankle Exercises: Stretches   Plantar Fascia Stretch  3 reps;20 seconds    Gastroc Stretch  3 reps;20 seconds      Ankle Exercises: Aerobic   Nustep  Level 4 x 8 minutes       Ankle Exercises: Standing   Rocker Board  3 minutes    Rebounder  weight shifting 3 ways x 1 minute each      Ankle Exercises: Seated   BAPS  Level  2;Sitting    BAPS Limitations  1 minute- 4 directions      Ankle Exercises: Supine   T-Band  red band: 4 ways               PT Short Term Goals - 09/22/17 2128      PT SHORT TERM GOAL #1   Title  The patient will express knowledge of basic self care for edema management, basic ROM and low level strengthening    Time  4    Period  Weeks    Status  New    Target Date  10/20/17      PT SHORT TERM GOAL #2   Title  The patient will report an overall improvement of 30% in ankle pain with walking, descending stairs for work travel    Time  4    Period  Weeks    Status  New      PT SHORT TERM GOAL #3  Title  The patient will have improved ankle dorsiflexion to 5 degrees needed for improved heel strike with gait     Time  4    Period  Weeks    Status  New      PT SHORT TERM GOAL #4   Title  Improved ankle inversion to 10 degrees and eversion to 25 degrees needed for gait on uneven terrain    Time  4    Period  Weeks    Status  New      PT SHORT TERM GOAL #5   Title  Circumferential measurements within 1-2 cm of left LE indicating decreased edema and atrophy    Time  4    Period  Weeks    Status  New        PT Long Term Goals - 09/22/17 2133      PT LONG TERM GOAL #1   Title  The patient will be independent with safe self progresssion of HEP    Time  8    Period  Weeks    Status  New    Target Date  11/17/17      PT LONG TERM GOAL #2   Title  The patient will have improved right ankle/foot pain by 60% with walking and descending stairs     Time  8    Period  Weeks    Status  New      PT LONG TERM GOAL #3   Title  The patient will have improved gastroc muscle length to 8 degrees needed to descend steps reciprocally    Time  8    Period  Weeks    Status  New      PT LONG TERM GOAL #4   Title  The patient will be able to walk 1 mile with minimal ankle pain.     Time  8    Period  Weeks    Status  New      PT LONG TERM GOAL #5   Title  Right  ankle/foot strength grossly 4-/5 needed for walking longer periods of time    Time  8    Period  Weeks    Status  New      Additional Long Term Goals   Additional Long Term Goals  Yes      PT LONG TERM GOAL #6   Title  FOTO functional outcome score improved from 52% limitation to 31%    Time  8    Period  Weeks    Status  New            Plan - 10/03/17 1610    Clinical Impression Statement  Pt with first session after evaluation.  Pt able to demonstrate all HEP correctly for strength and flexibility.  Pt tolerated seated and standing proprioception exercises with Rt ankle and required minor tatile cues and assistance with theraband exercises to reduce substitution of hip.  Pt demonstrates mild antalgia with gait on level surfaces.  Pt with mild pocket of edema over Rt lateral ankle at malleolus.  Pt will continue to benefit from skilled PT for Rt ankle strength, proprioception and flexibility.      Rehab Potential  Good    PT Frequency  2x / week    PT Duration  8 weeks    PT Treatment/Interventions  ADLs/Self Care Home Management;Cryotherapy;Electrical Stimulation;Ultrasound;Moist Heat;Gait training;Stair training;Therapeutic activities;Therapeutic exercise;Patient/family education;Neuromuscular re-education;Manual techniques;Dry needling;Taping;Vasopneumatic Device    PT Next Visit  Plan  Continue to address Rt ankle flexibility, strength proprioception and flexibility    PT Home Exercise Plan   Access Code: [email or web address]     Recommended Other Services  initial cert is signed    Consulted and Agree with Plan of Care  Patient       Patient will benefit from skilled therapeutic intervention in order to improve the following deficits and impairments:  Pain, Increased edema, Difficulty walking, Decreased range of motion, Decreased strength, Hypomobility, Impaired perceived functional ability, Decreased activity tolerance  Visit Diagnosis: Pain in right ankle and joints of right  foot  Stiffness of right ankle, not elsewhere classified  Muscle weakness (generalized)  Difficulty in walking, not elsewhere classified  Localized edema     Problem List Patient Active Problem List   Diagnosis Date Noted  . Closed nondisplaced fracture of right calcaneus with routine healing 08/03/2017  . Closed nondisplaced fracture of right calcaneus 07/06/2017     Lorrene ReidKelly Takacs, PT 10/03/17 10:19 AM  [facility] [address], [address] [place], [place], [address] Phone: [phone]   Fax:  [phone]  Name: Katherine Dunn MRN: [id number] Date of Birth: 11/13/1960

## 2017-10-07 ENCOUNTER — Encounter: Payer: Self-pay | Admitting: Physical Therapy

## 2017-10-07 ENCOUNTER — Ambulatory Visit: Payer: Managed Care, Other (non HMO) | Admitting: Physical Therapy

## 2017-10-07 DIAGNOSIS — M25671 Stiffness of right ankle, not elsewhere classified: Secondary | ICD-10-CM

## 2017-10-07 DIAGNOSIS — R600 Localized edema: Secondary | ICD-10-CM

## 2017-10-07 DIAGNOSIS — M25571 Pain in right ankle and joints of right foot: Secondary | ICD-10-CM

## 2017-10-07 DIAGNOSIS — M6281 Muscle weakness (generalized): Secondary | ICD-10-CM

## 2017-10-07 DIAGNOSIS — R262 Difficulty in walking, not elsewhere classified: Secondary | ICD-10-CM

## 2017-10-07 NOTE — Therapy (Signed)
[facility] [address] [place], [place], [address] Phone: [phone]   Fax:  [phone]  Physical Therapy Treatment  Patient Details  Name: Katherine Dunn MRN: [id number] Date of Birth: 11/11/1960 Referring Provider: Dr. Magnus IvanBlackman    Encounter Date: 10/07/2017  PT End of Session - 10/07/17 1204    Visit Number  3    Date for PT Re-Evaluation  11/17/17    Authorization Type  [employer]- no visit limit    PT Start Time  1015    PT Stop Time  1100    PT Time Calculation (min)  45 min    Activity Tolerance  Patient tolerated treatment well       History reviewed. No pertinent past medical history.  Past Surgical History:  Procedure Laterality Date  . BUNIONECTOMY      There were no vitals filed for this visit.  Subjective Assessment - 10/07/17 1018    Subjective  I loved that rocker board.  I want to get one for home.  After a lot of traveling, I lose all flexibility.      Pertinent History  bunionectomy left ;  former gymnast    Currently in Pain?  Yes    Pain Score  4     Pain Location  Ankle    Pain Orientation  Right    Pain Type  Acute pain    Aggravating Factors   traveling, walking                        [facility] Adult PT Treatment/Exercise - 10/07/17 0001      Manual Therapy   Manual Therapy  Soft tissue mobilization    Joint Mobilization  grade 1 talar glides, medial and lateral glides 3x 30 sec each    Soft tissue mobilization  gastroc to Achilles; plantar fascia stretch       Ankle Exercises: Aerobic   Nustep  Level 4 x 8 minutes       Ankle Exercises: Standing   Vector Stance Limitations  attempted to do SLS on right but discontinued secondary to pain     Rocker Board  3 minutes    Rebounder  weight shifting 3 ways x 1 minute each    Heel Raises Limitations  10x      Ankle Exercises: Seated   BAPS  Level 2;Sitting    BAPS Limitations  1 minute- 4 directions                PT Short Term Goals - 09/22/17 2128      PT SHORT TERM GOAL #1   Title  The patient will express knowledge of basic self care for edema management, basic ROM and low level strengthening    Time  4    Period  Weeks    Status  New    Target Date  10/20/17      PT SHORT TERM GOAL #2   Title  The patient will report an overall improvement of 30% in ankle pain with walking, descending stairs for work travel    Time  4    Period  Weeks    Status  New      PT SHORT TERM GOAL #3   Title  The patient will have improved ankle dorsiflexion to 5 degrees needed for improved heel strike with gait     Time  4    Period  Weeks  Status  New      PT SHORT TERM GOAL #4   Title  Improved ankle inversion to 10 degrees and eversion to 25 degrees needed for gait on uneven terrain    Time  4    Period  Weeks    Status  New      PT SHORT TERM GOAL #5   Title  Circumferential measurements within 1-2 cm of left LE indicating decreased edema and atrophy    Time  4    Period  Weeks    Status  New        PT Long Term Goals - 09/22/17 2133      PT LONG TERM GOAL #1   Title  The patient will be independent with safe self progresssion of HEP    Time  8    Period  Weeks    Status  New    Target Date  11/17/17      PT LONG TERM GOAL #2   Title  The patient will have improved right ankle/foot pain by 60% with walking and descending stairs     Time  8    Period  Weeks    Status  New      PT LONG TERM GOAL #3   Title  The patient will have improved gastroc muscle length to 8 degrees needed to descend steps reciprocally    Time  8    Period  Weeks    Status  New      PT LONG TERM GOAL #4   Title  The patient will be able to walk 1 mile with minimal ankle pain.     Time  8    Period  Weeks    Status  New      PT LONG TERM GOAL #5   Title  Right ankle/foot strength grossly 4-/5 needed for walking longer periods of time    Time  8    Period  Weeks    Status  New       Additional Long Term Goals   Additional Long Term Goals  Yes      PT LONG TERM GOAL #6   Title  FOTO functional outcome score improved from 52% limitation to 31%    Time  8    Period  Weeks    Status  New            Plan - 10/07/17 1204    Clinical Impression Statement  The patient has a much improved gait pattern with improved heel strike and stance time on right.  Decreasing lateral ankle edema and tenderness although still painful on the lateral ankle.  Too painful to single leg stand on right.  She would benefit from continued PT to address these deficits.      Rehab Potential  Good    Clinical Impairments Affecting Rehab Potential  none    PT Frequency  2x / week    PT Duration  8 weeks    PT Treatment/Interventions  ADLs/Self Care Home Management;Cryotherapy;Electrical Stimulation;Ultrasound;Moist Heat;Gait training;Stair training;Therapeutic activities;Therapeutic exercise;Patient/family education;Neuromuscular re-education;Manual techniques;Dry needling;Taping;Vasopneumatic Device    PT Next Visit Plan  Continue to address Rt ankle flexibility, strength proprioception and flexibility; recheck ROM, MMT    PT Home Exercise Plan   Access Code: [email or web address]        Patient will benefit from skilled therapeutic intervention in order to improve the following deficits and impairments:  Pain, Increased edema, Difficulty walking, Decreased  range of motion, Decreased strength, Hypomobility, Impaired perceived functional ability, Decreased activity tolerance  Visit Diagnosis: Pain in right ankle and joints of right foot  Stiffness of right ankle, not elsewhere classified  Muscle weakness (generalized)  Difficulty in walking, not elsewhere classified  Localized edema     Problem List Patient Active Problem List   Diagnosis Date Noted  . Closed nondisplaced fracture of right calcaneus with routine healing 08/03/2017  . Closed nondisplaced fracture of right calcaneus  07/06/2017   Lavinia Sharps, PT 10/07/17 12:08 PM Phone: [phone] Fax: [phone]  Vivien Presto 10/07/2017, 12:08 PM  [facility] [address], [address] [place], [place], [address] Phone: [phone]   Fax:  [phone]  Name: Katherine Dunn MRN: [id number] Date of Birth: Dec 07, 1960

## 2017-10-18 ENCOUNTER — Ambulatory Visit: Payer: Managed Care, Other (non HMO) | Admitting: Physical Therapy

## 2017-10-18 ENCOUNTER — Encounter: Payer: Self-pay | Admitting: Physical Therapy

## 2017-10-18 DIAGNOSIS — R262 Difficulty in walking, not elsewhere classified: Secondary | ICD-10-CM

## 2017-10-18 DIAGNOSIS — M25671 Stiffness of right ankle, not elsewhere classified: Secondary | ICD-10-CM

## 2017-10-18 DIAGNOSIS — R600 Localized edema: Secondary | ICD-10-CM

## 2017-10-18 DIAGNOSIS — M6281 Muscle weakness (generalized): Secondary | ICD-10-CM

## 2017-10-18 DIAGNOSIS — M25571 Pain in right ankle and joints of right foot: Secondary | ICD-10-CM

## 2017-10-18 NOTE — Therapy (Signed)
[facility] [address] [place], [place], [address] Phone: [phone]   Fax:  [phone]  Physical Therapy Treatment  Patient Details  Name: Katherine Dunn MRN: [id number] Date of Birth: [birth year] Referring Provider: Dr. Ninfa Linden    Encounter Date: 10/18/2017  PT End of Session - 10/18/17 1720    Visit Number  4    Date for PT Re-Evaluation  11/17/17    Authorization Type  [employer]- no visit limit    PT Start Time  0937    PT Stop Time  1015    PT Time Calculation (min)  38 min    Activity Tolerance  Patient tolerated treatment well       History reviewed. No pertinent past medical history.  Past Surgical History:  Procedure Laterality Date  . BUNIONECTOMY      There were no vitals filed for this visit.  Subjective Assessment - 10/18/17 0937    Subjective  These shoes bother me on the side of my foot.  I haven't been good about my exercises b/c of work.  I worked out in the yard 6 hours Sunday and it was OK but it did swell.   Stepped in an unlevel area that caused a lot of pain.  I feel it every step.      Pertinent History  bunionectomy left ;  former gymnast    Currently in Pain?  Yes    Pain Score  4     Pain Location  Foot    Pain Orientation  Right    Pain Type  Acute pain    Aggravating Factors   unlevel ground         [facility] PT Assessment - 10/18/17 0001      Observation/Other Assessments-Edema    Edema  --   47 cm; 32 1/2     AROM   Right Ankle Dorsiflexion  10    Right Ankle Plantar Flexion  60    Right Ankle Inversion  24    Right Ankle Eversion  28      Strength   Right Ankle Dorsiflexion  4-/5    Right Ankle Plantar Flexion  4-/5    Right Ankle Inversion  3+/5    Right Ankle Eversion  3+/5                   [facility] Adult PT Treatment/Exercise - 10/18/17 0001      Manual Therapy   Joint Mobilization  grade 1 talar glides, medial and lateral glides 3x 30 sec each     Soft tissue mobilization  gastroc to Achilles; plantar fascia stretch       Ankle Exercises: Aerobic   Nustep  Level 3 x 8 minutes       Ankle Exercises: Stretches   Slant Board Stretch  3 reps;20 seconds      Ankle Exercises: Machines for Strengthening   Cybex Leg Press  calf press 30# 2x10 mostly single leg      Ankle Exercises: Standing   BAPS  Standing;Level 1;10 reps   PF/DF; Retail buyer Board  3 minutes    Other Standing Ankle Exercises  WB on right with step taps on left 10x    Other Standing Ankle Exercises  WB on right with left 3 ways 10x               PT Short Term Goals - 10/18/17 1724  PT SHORT TERM GOAL #1   Title  The patient will express knowledge of basic self care for edema management, basic ROM and low level strengthening    Status  Achieved      PT SHORT TERM GOAL #2   Title  The patient will report an overall improvement of 30% in ankle pain with walking, descending stairs for work travel    Time  4    Period  Weeks    Status  On-going      PT SHORT TERM GOAL #3   Title  The patient will have improved ankle dorsiflexion to 5 degrees needed for improved heel strike with gait     Status  Achieved      PT SHORT TERM GOAL #4   Title  Improved ankle inversion to 10 degrees and eversion to 25 degrees needed for gait on uneven terrain    Status  Achieved      PT SHORT TERM GOAL #5   Title  Circumferential measurements within 1-2 cm of left LE indicating decreased edema and atrophy    Status  Achieved        PT Long Term Goals - 09/22/17 2133      PT LONG TERM GOAL #1   Title  The patient will be independent with safe self progresssion of HEP    Time  8    Period  Weeks    Status  New    Target Date  11/17/17      PT LONG TERM GOAL #2   Title  The patient will have improved right ankle/foot pain by 60% with walking and descending stairs     Time  8    Period  Weeks    Status  New      PT LONG TERM GOAL  #3   Title  The patient will have improved gastroc muscle length to 8 degrees needed to descend steps reciprocally    Time  8    Period  Weeks    Status  New      PT LONG TERM GOAL #4   Title  The patient will be able to walk 1 mile with minimal ankle pain.     Time  8    Period  Weeks    Status  New      PT LONG TERM GOAL #5   Title  Right ankle/foot strength grossly 4-/5 needed for walking longer periods of time    Time  8    Period  Weeks    Status  New      Additional Long Term Goals   Additional Long Term Goals  Yes      PT LONG TERM GOAL #6   Title  FOTO functional outcome score improved from 52% limitation to 31%    Time  8    Period  Weeks    Status  New            Plan - 10/18/17 1722    Clinical Impression Statement  The patient has made signficant improvements in ankle ROM in all planes as well as edema reduction.  Gastroc muscle definition improving as well with less visible atrophy and increase girth size.  She is able to full weight bear on right, whereas last visit she was unable to do so, although still painful lateral ankle.  Able to progress to more standing with increased weight bearing today.  Majority of STGs met.  Rehab Potential  Good    Clinical Impairments Affecting Rehab Potential  none    PT Frequency  2x / week    PT Duration  8 weeks    PT Treatment/Interventions  ADLs/Self Care Home Management;Cryotherapy;Electrical Stimulation;Ultrasound;Moist Heat;Gait training;Stair training;Therapeutic activities;Therapeutic exercise;Patient/family education;Neuromuscular re-education;Manual techniques;Dry needling;Taping;Vasopneumatic Device    PT Next Visit Plan  Continue to address Rt ankle flexibility, strength proprioception and flexibility;  check pain STG next visit     PT Home Exercise Plan   Access Code: [email or web address]        Patient will benefit from skilled therapeutic intervention in order to improve the following deficits and impairments:   Pain, Increased edema, Difficulty walking, Decreased range of motion, Decreased strength, Hypomobility, Impaired perceived functional ability, Decreased activity tolerance  Visit Diagnosis: Pain in right ankle and joints of right foot  Stiffness of right ankle, not elsewhere classified  Muscle weakness (generalized)  Difficulty in walking, not elsewhere classified  Localized edema     Problem List Patient Active Problem List   Diagnosis Date Noted  . Closed nondisplaced fracture of right calcaneus with routine healing 08/03/2017  . Closed nondisplaced fracture of right calcaneus 07/06/2017   Ruben Im, PT 10/18/17 5:26 PM Phone: [phone] Fax: [phone]  Alvera Singh 10/18/2017, 5:26 PM  [facility] [address], [place], [place], [address] Phone: [phone]   Fax:  [phone]  Name: Ellyson Rarick MRN: [id number] Date of Birth: 1960-10-03

## 2017-10-21 ENCOUNTER — Ambulatory Visit: Payer: Managed Care, Other (non HMO) | Admitting: Physical Therapy

## 2017-10-21 ENCOUNTER — Encounter: Payer: Self-pay | Admitting: Physical Therapy

## 2017-10-21 DIAGNOSIS — M25571 Pain in right ankle and joints of right foot: Secondary | ICD-10-CM | POA: Diagnosis not present

## 2017-10-21 DIAGNOSIS — R600 Localized edema: Secondary | ICD-10-CM

## 2017-10-21 DIAGNOSIS — R262 Difficulty in walking, not elsewhere classified: Secondary | ICD-10-CM

## 2017-10-21 DIAGNOSIS — M6281 Muscle weakness (generalized): Secondary | ICD-10-CM

## 2017-10-21 DIAGNOSIS — M25671 Stiffness of right ankle, not elsewhere classified: Secondary | ICD-10-CM

## 2017-10-21 NOTE — Therapy (Signed)
[facility] [address] [place], [place], [address] Phone: [phone]   Fax:  [phone]  Physical Therapy Treatment  Patient Details  Name: Katherine Dunn MRN: [id number] Date of Birth: [birth year] Referring Provider: Dr. Ninfa Linden    Encounter Date: 10/21/2017  PT End of Session - 10/21/17 0939    Visit Number  5    Date for PT Re-Evaluation  11/17/17    Authorization Type  [employer]- no visit limit    PT Start Time  0936    PT Stop Time  1015    PT Time Calculation (min)  39 min       History reviewed. No pertinent past medical history.  Past Surgical History:  Procedure Laterality Date  . BUNIONECTOMY      There were no vitals filed for this visit.  Subjective Assessment - 10/21/17 0940    Subjective  I have some moments where I forget I have this but then half through my shopping before I realized it.  Stiffness this morning.      Currently in Pain?  Yes    Pain Score  2     Pain Location  Foot    Pain Orientation  Right    Pain Type  Acute pain    Aggravating Factors   I'm doing better with unlevel ground                       [facility] Adult PT Treatment/Exercise - 10/21/17 0001      Ankle Exercises: Aerobic   Stationary Bike  L2 8:30 min       Ankle Exercises: Standing   BAPS  Standing;Level 2;10 reps   PF/DF; Retail buyer Board  3 minutes    Rebounder  WB on right, left on BOSU with 2# plyoball toss to [employer] (Round Trip)  high step floor ladder     Toe Walk (Round Trip)  step in/out of floor ladder     Side Shuffle (Round Trip)  floor ladder    Other Standing Ankle Exercises  4 ways with yellow band single arm support right/left     Other Standing Ankle Exercises  SLS with left foot on BOSU       Ankle Exercises: Stretches   Slant Board Stretch  2 reps;20 seconds      Ankle Exercises: Machines for Strengthening   Cybex Leg Press   calf press 40# 2x10 mostly single leg               PT Short Term Goals - 10/21/17 1202      PT SHORT TERM GOAL #1   Title  The patient will express knowledge of basic self care for edema management, basic ROM and low level strengthening    Status  Achieved      PT SHORT TERM GOAL #2   Title  The patient will report an overall improvement of 30% in ankle pain with walking, descending stairs for work travel    Status  Achieved      PT SHORT TERM GOAL #3   Title  The patient will have improved ankle dorsiflexion to 5 degrees needed for improved heel strike with gait     Status  Achieved      PT SHORT TERM GOAL #4   Title  Improved ankle inversion to 10 degrees and eversion to 25 degrees needed for gait  on uneven terrain    Status  Achieved      PT SHORT TERM GOAL #5   Title  Circumferential measurements within 1-2 cm of left LE indicating decreased edema and atrophy    Status  Achieved        PT Long Term Goals - 09/22/17 2133      PT LONG TERM GOAL #1   Title  The patient will be independent with safe self progresssion of HEP    Time  8    Period  Weeks    Status  New    Target Date  11/17/17      PT LONG TERM GOAL #2   Title  The patient will have improved right ankle/foot pain by 60% with walking and descending stairs     Time  8    Period  Weeks    Status  New      PT LONG TERM GOAL #3   Title  The patient will have improved gastroc muscle length to 8 degrees needed to descend steps reciprocally    Time  8    Period  Weeks    Status  New      PT LONG TERM GOAL #4   Title  The patient will be able to walk 1 mile with minimal ankle pain.     Time  8    Period  Weeks    Status  New      PT LONG TERM GOAL #5   Title  Right ankle/foot strength grossly 4-/5 needed for walking longer periods of time    Time  8    Period  Weeks    Status  New      Additional Long Term Goals   Additional Long Term Goals  Yes      PT LONG TERM GOAL #6   Title  FOTO  functional outcome score improved from 52% limitation to 31%    Time  8    Period  Weeks    Status  New            Plan - 10/21/17 1200    Clinical Impression Statement  The patient continues to make steady progress with right LE weight bearing and proprioception.  She is able to perform intermediate level proprioceptive and strengthening exercises wtih decreasing UE support.  Pain is present but low in intensity.  All STGS met.    Rehab Potential  Good    Clinical Impairments Affecting Rehab Potential  none    PT Frequency  2x / week    PT Duration  8 weeks    PT Treatment/Interventions  ADLs/Self Care Home Management;Cryotherapy;Electrical Stimulation;Ultrasound;Moist Heat;Gait training;Stair training;Therapeutic activities;Therapeutic exercise;Patient/family education;Neuromuscular re-education;Manual techniques;Dry needling;Taping;Vasopneumatic Device    PT Next Visit Plan  Continue to address Rt ankle flexibility, strength proprioception and flexibility       Patient will benefit from skilled therapeutic intervention in order to improve the following deficits and impairments:  Pain, Increased edema, Difficulty walking, Decreased range of motion, Decreased strength, Hypomobility, Impaired perceived functional ability, Decreased activity tolerance  Visit Diagnosis: Pain in right ankle and joints of right foot  Stiffness of right ankle, not elsewhere classified  Muscle weakness (generalized)  Difficulty in walking, not elsewhere classified  Localized edema     Problem List Patient Active Problem List   Diagnosis Date Noted  . Closed nondisplaced fracture of right calcaneus with routine healing 08/03/2017  . Closed nondisplaced fracture of right calcaneus  07/06/2017   Ruben Im, PT 10/21/17 12:04 PM Phone: [phone] Fax: [phone]  Katherine Dunn 10/21/2017, 12:03 PM  [facility] [address],  [place], [place], [address] Phone: [phone]   Fax:  [phone]  Name: Katherine Dunn MRN: [id number] Date of Birth: 06/26/1960

## 2017-10-27 ENCOUNTER — Ambulatory Visit: Payer: Managed Care, Other (non HMO)

## 2017-10-27 DIAGNOSIS — M6281 Muscle weakness (generalized): Secondary | ICD-10-CM

## 2017-10-27 DIAGNOSIS — M25571 Pain in right ankle and joints of right foot: Secondary | ICD-10-CM | POA: Diagnosis not present

## 2017-10-27 DIAGNOSIS — R600 Localized edema: Secondary | ICD-10-CM

## 2017-10-27 DIAGNOSIS — R262 Difficulty in walking, not elsewhere classified: Secondary | ICD-10-CM

## 2017-10-27 DIAGNOSIS — M25671 Stiffness of right ankle, not elsewhere classified: Secondary | ICD-10-CM

## 2017-10-27 NOTE — Therapy (Signed)
[facility] [address] [place], [place], [address] Phone: [phone]   Fax:  [phone]  Physical Therapy Treatment  Patient Details  Name: Katherine Dunn MRN: [id number] Date of Birth: 09/04/1960 Referring Provider: Dr. Magnus Ivan    Encounter Date: 10/27/2017  PT End of Session - 10/27/17 0929    Visit Number  6    Date for PT Re-Evaluation  11/17/17    Authorization Type  [employer]- no visit limit    PT Start Time  0855    PT Stop Time  0930    PT Time Calculation (min)  35 min    Activity Tolerance  Patient tolerated treatment well    Behavior During Therapy  [facility] for tasks assessed/performed       History reviewed. No pertinent past medical history.  Past Surgical History:  Procedure Laterality Date  . BUNIONECTOMY      There were no vitals filed for this visit.  Subjective Assessment - 10/27/17 0858    Subjective  I shaved 10 minutes off of my walk this week.  I walked 1.5 miles- some edema and soreness after.      Currently in Pain?  Yes    Pain Score  1     Pain Location  Foot    Pain Orientation  Right    Pain Descriptors / Indicators  Tightness;Sore    Pain Onset  More than a month ago    Pain Frequency  Constant    Aggravating Factors   walking long distances    Pain Relieving Factors  rest, ice, not standing                       [facility] Adult PT Treatment/Exercise - 10/27/17 0001      Exercises   Exercises  Knee/Hip;Ankle      Knee/Hip Exercises: Standing   Forward Step Up  Right;2 sets;10 reps    Forward Step Up Limitations  Bosu ball with static stance on the Rt at thte top    Walking with Sports Cord  20# 4 ways x 10 each    Other Standing Knee Exercises  standing on flat surface of bosu: 3x 30 seconds      Ankle Exercises: Aerobic   Stationary Bike  L2 8  min    PT present to discuss progress     Ankle Exercises: Standing   Rocker Board  3 minutes                PT Short Term Goals - 10/21/17 1202      PT SHORT TERM GOAL #1   Title  The patient will express knowledge of basic self care for edema management, basic ROM and low level strengthening    Status  Achieved      PT SHORT TERM GOAL #2   Title  The patient will report an overall improvement of 30% in ankle pain with walking, descending stairs for work travel    Status  Achieved      PT SHORT TERM GOAL #3   Title  The patient will have improved ankle dorsiflexion to 5 degrees needed for improved heel strike with gait     Status  Achieved      PT SHORT TERM GOAL #4   Title  Improved ankle inversion to 10 degrees and eversion to 25 degrees needed for gait on uneven terrain    Status  Achieved  PT SHORT TERM GOAL #5   Title  Circumferential measurements within 1-2 cm of left LE indicating decreased edema and atrophy    Status  Achieved        PT Long Term Goals - 10/27/17 0859      PT LONG TERM GOAL #1   Title  The patient will be independent with safe self progresssion of HEP    Time  8    Period  Weeks    Status  On-going      PT LONG TERM GOAL #4   Title  The patient will be able to walk 1 mile with minimal ankle pain.     Status  Achieved            Plan - 10/27/17 0911    Clinical Impression Statement  Pt is making steady progress and is now able to walk > 1 mile for exercise without increased pain.  Pt does report edema after long periods of walking.  Pt is working to improve proprioception and high level strength to improve safety on unlevel surfaces and to improve endurance for exercise.  Pt required minor tactile cues for positioning with exercise.  Pt will continue to benefit from skilled PT for balance, Rt ankle strength and stability and endurance exercise.      Rehab Potential  Good    PT Frequency  2x / week    PT Duration  8 weeks    PT Treatment/Interventions  ADLs/Self Care Home Management;Cryotherapy;Electrical  Stimulation;Ultrasound;Moist Heat;Gait training;Stair training;Therapeutic activities;Therapeutic exercise;Patient/family education;Neuromuscular re-education;Manual techniques;Dry needling;Taping;Vasopneumatic Device    PT Next Visit Plan  Continue to address Rt ankle flexibility, strength proprioception and flexibility    PT Home Exercise Plan   Access Code: [email or web address]     Consulted and Agree with Plan of Care  Patient       Patient will benefit from skilled therapeutic intervention in order to improve the following deficits and impairments:  Pain, Increased edema, Difficulty walking, Decreased range of motion, Decreased strength, Hypomobility, Impaired perceived functional ability, Decreased activity tolerance  Visit Diagnosis: Pain in right ankle and joints of right foot  Stiffness of right ankle, not elsewhere classified  Muscle weakness (generalized)  Difficulty in walking, not elsewhere classified  Localized edema     Problem List Patient Active Problem List   Diagnosis Date Noted  . Closed nondisplaced fracture of right calcaneus with routine healing 08/03/2017  . Closed nondisplaced fracture of right calcaneus 07/06/2017     Lorrene ReidKelly Takacs, PT 10/27/17 9:36 AM  [facility] [address], [address] [place], [place], [address] Phone: [phone]   Fax:  [phone]  Name: Katherine Dunn MRN: [id number] Date of Birth: 11/18/1960

## 2017-11-03 ENCOUNTER — Ambulatory Visit: Payer: Managed Care, Other (non HMO) | Attending: Orthopaedic Surgery

## 2017-11-03 DIAGNOSIS — M6281 Muscle weakness (generalized): Secondary | ICD-10-CM | POA: Diagnosis present

## 2017-11-03 DIAGNOSIS — M25671 Stiffness of right ankle, not elsewhere classified: Secondary | ICD-10-CM

## 2017-11-03 DIAGNOSIS — R262 Difficulty in walking, not elsewhere classified: Secondary | ICD-10-CM | POA: Diagnosis present

## 2017-11-03 DIAGNOSIS — M25571 Pain in right ankle and joints of right foot: Secondary | ICD-10-CM | POA: Insufficient documentation

## 2017-11-03 NOTE — Therapy (Signed)
[facility] [address], [address] [place], [place], [address] Phone: [phone]   Fax:  [phone]  Physical Therapy Treatment  Patient Details  Name: Katherine Dunn MRN: [id number] Date of Birth: [birth year] Referring Provider: Dr. Magnus Ivan    Encounter Date: 11/03/2017  PT End of Session - 11/03/17 1055    Visit Number  7    Date for PT Re-Evaluation  11/17/17    Authorization Type  [employer]- no visit limit    PT Start Time  1019    PT Stop Time  1059    PT Time Calculation (min)  40 min    Activity Tolerance  Patient tolerated treatment well    Behavior During Therapy  [facility] for tasks assessed/performed       History reviewed. No pertinent past medical history.  Past Surgical History:  Procedure Laterality Date  . BUNIONECTOMY      There were no vitals filed for this visit.  Subjective Assessment - 11/03/17 1021    Subjective  I haven't walked for 3 days.  I have a lot of time where I dont feel any pain and then I get pain intermittently.      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  Yes    Pain Score  0-No pain    Pain Location  Foot    Pain Orientation  Right    Pain Descriptors / Indicators  Tightness;Sore    Pain Type  Acute pain    Pain Onset  More than a month ago    Pain Frequency  Intermittent    Aggravating Factors   walking long distances          [facility] PT Assessment - 11/03/17 0001      AROM   Right Ankle Dorsiflexion  10    Right Ankle Inversion  30    Right Ankle Eversion  25      Strength   Right Ankle Dorsiflexion  4+/5    Right Ankle Inversion  4+/5    Right Ankle Eversion  4/5                   [facility] Adult PT Treatment/Exercise - 11/03/17 0001      Exercises   Exercises  Knee/Hip;Ankle      Knee/Hip Exercises: Standing   Forward Step Up  Right;2 sets;10 reps   on bosu   Forward  Step Up Limitations  Bosu ball with static stance on the Rt at thte top    Step Down  Right;2 sets;10 reps    Walking with Sports Cord  20# 4 ways x 10 each    Other Standing Knee Exercises  standing on flat surface of bosu: 3x 30 seconds      Ankle Exercises: Aerobic   Stationary Bike  L2 8  min    PT present to discuss progress     Ankle Exercises: Standing   Rocker Board  3 minutes               PT Short Term Goals - 10/21/17 1202      PT SHORT TERM GOAL #1   Title  The patient will express knowledge of basic self care for edema management, basic ROM and low level strengthening    Status  Achieved      PT SHORT  TERM GOAL #2   Title  The patient will report an overall improvement of 30% in ankle pain with walking, descending stairs for work travel    Status  Achieved      PT SHORT TERM GOAL #3   Title  The patient will have improved ankle dorsiflexion to 5 degrees needed for improved heel strike with gait     Status  Achieved      PT SHORT TERM GOAL #4   Title  Improved ankle inversion to 10 degrees and eversion to 25 degrees needed for gait on uneven terrain    Status  Achieved      PT SHORT TERM GOAL #5   Title  Circumferential measurements within 1-2 cm of left LE indicating decreased edema and atrophy    Status  Achieved        PT Long Term Goals - 11/03/17 1031      PT LONG TERM GOAL #2   Title  The patient will have improved right ankle/foot pain by 60% with walking and descending stairs     Baseline  75-80%    Status  Achieved      PT LONG TERM GOAL #3   Title  The patient will have improved gastroc muscle length to 8 degrees needed to descend steps reciprocally    Baseline  10 degrees    Status  Achieved      PT LONG TERM GOAL #4   Title  The patient will be able to walk 1 mile with minimal ankle pain.     Status  Achieved      PT LONG TERM GOAL #5   Title  Right ankle/foot strength grossly 4-/5 needed for walking longer periods of time     Baseline  DF 4+/5, inversion 4+/5, eversion 4/5    Status  Achieved            Plan - 11/03/17 1030    Clinical Impression Statement  Pt is making steady progress and is now able to walk > 1 mile for exercise without increased pain.  Pt does report edema after long periods of walking. Pt has not been walking recently due to schedule but reports that walking is easier on unlevel surfaces.  Pt with improved Rt ankle A/ROM and strength.  Pt is working to improve proprioception and high level strength to improve safety on unlevel surfaces and to improve endurance for exercise.  Pt required minor tactile cues for positioning with exercise.  Pt will continue to benefit from skilled PT for balance, Rt ankle strength and stability and endurance exercise.       Rehab Potential  Good    PT Frequency  2x / week    PT Duration  8 weeks    PT Treatment/Interventions  ADLs/Self Care Home Management;Cryotherapy;Electrical Stimulation;Ultrasound;Moist Heat;Gait training;Stair training;Therapeutic activities;Therapeutic exercise;Patient/family education;Neuromuscular re-education;Manual techniques;Dry needling;Taping;Vasopneumatic Device    PT Next Visit Plan  Continue to address Rt ankle flexibility, strength proprioception and flexibility- probable D/C next week    PT Home Exercise Plan   Access Code: [email or web address]     Consulted and Agree with Plan of Care  Patient       Patient will benefit from skilled therapeutic intervention in order to improve the following deficits and impairments:  Pain, Increased edema, Difficulty walking, Decreased range of motion, Decreased strength, Hypomobility, Impaired perceived functional ability, Decreased activity tolerance  Visit Diagnosis: Pain in right ankle and joints of right foot  Stiffness of right  ankle, not elsewhere classified  Muscle weakness (generalized)  Difficulty in walking, not elsewhere classified     Problem List Patient Active Problem List    Diagnosis Date Noted  . Closed nondisplaced fracture of right calcaneus with routine healing 08/03/2017  . Closed nondisplaced fracture of right calcaneus 07/06/2017     Lorrene Reid, PT 11/03/17 10:57 AM  [facility] [address], [address] [place], [place], [address] Phone: [phone]   Fax:  [phone]  Name: Helma Achen MRN: [id number] Date of Birth: 10-Dec-1960

## 2017-11-14 ENCOUNTER — Ambulatory Visit: Payer: Managed Care, Other (non HMO)

## 2017-11-14 DIAGNOSIS — M25571 Pain in right ankle and joints of right foot: Secondary | ICD-10-CM | POA: Diagnosis not present

## 2017-11-14 DIAGNOSIS — R262 Difficulty in walking, not elsewhere classified: Secondary | ICD-10-CM

## 2017-11-14 DIAGNOSIS — M6281 Muscle weakness (generalized): Secondary | ICD-10-CM

## 2017-11-14 DIAGNOSIS — M25671 Stiffness of right ankle, not elsewhere classified: Secondary | ICD-10-CM

## 2017-11-14 NOTE — Therapy (Signed)
[facility] [address], [place], [place], [address] Phone: [phone]   Fax:  [phone]  Physical Therapy Treatment  Patient Details  Name: Katherine Dunn MRN: [id number] Date of Birth: 21-Jan-1961 Referring Provider: Dr. Ninfa Linden    Encounter Date: 11/14/2017  PT End of Session - 11/14/17 1145    Visit Number  8    Authorization Type  [employer]- no visit limit    PT Start Time  1100    PT Stop Time  1146    PT Time Calculation (min)  46 min    Activity Tolerance  Patient tolerated treatment well    Behavior During Therapy  [facility] for tasks assessed/performed       History reviewed. No pertinent past medical history.  Past Surgical History:  Procedure Laterality Date  . BUNIONECTOMY      There were no vitals filed for this visit.  Subjective Assessment - 11/14/17 1111    Subjective  My foot is sore from walking on the beach.      Currently in Pain?  Yes    Pain Score  1     Pain Location  Foot    Pain Orientation  Right    Pain Descriptors / Indicators  Tightness;Sore    Pain Type  Acute pain    Pain Onset  More than a month ago    Pain Frequency  Intermittent    Aggravating Factors   walking long distances, yard work    Pain Relieving Factors  rest, ice          [facility] PT Assessment - 11/14/17 0001      Assessment   Medical Diagnosis  right calcaneal fracture; pain in right foot    Onset Date/Surgical Date  06/17/17      AROM   Right Ankle Dorsiflexion  12    Right Ankle Inversion  34    Right Ankle Eversion  25      Strength   Right Ankle Dorsiflexion  4+/5    Right Ankle Inversion  4+/5    Right Ankle Eversion  4/5                   [facility] Adult PT Treatment/Exercise - 11/14/17 0001      Knee/Hip Exercises: Standing   Forward Step Up  Right;2 sets;10 reps   on bosu   Forward Step Up Limitations  Bosu ball with static stance on the Rt at thte top    Step Down  Right;2  sets;10 reps    Walking with Sports Cord  25# 4 ways x 10 each    Other Standing Knee Exercises  standing on flat surface of bosu: 3x 30 seconds      Ankle Exercises: Aerobic   Stationary Bike  L2 8  min    PT present to discuss progress     Ankle Exercises: Standing   Rocker Board  3 minutes             PT Education - 11/14/17 1146    Education Details  discussed ankle brace for unlevel surfaces for hiking and a good flip flop for arch support    Person(s) Educated  Patient    Methods  Explanation    Comprehension  Verbalized understanding       PT Short Term Goals - 10/21/17 1202      PT SHORT TERM GOAL #1   Title  The patient will express knowledge  of basic self care for edema management, basic ROM and low level strengthening    Status  Achieved      PT SHORT TERM GOAL #2   Title  The patient will report an overall improvement of 30% in ankle pain with walking, descending stairs for work travel    Status  Achieved      PT [place] #3   Title  The patient will have improved ankle dorsiflexion to 5 degrees needed for improved heel strike with gait     Status  Achieved      PT SHORT TERM GOAL #4   Title  Improved ankle inversion to 10 degrees and eversion to 25 degrees needed for gait on uneven terrain    Status  Achieved      PT SHORT TERM GOAL #5   Title  Circumferential measurements within 1-2 cm of left LE indicating decreased edema and atrophy    Status  Achieved        PT Long Term Goals - 11/14/17 1117      PT LONG TERM GOAL #1   Title  The patient will be independent with safe self progresssion of HEP    Status  Achieved      PT LONG TERM GOAL #2   Title  The patient will have improved right ankle/foot pain by 60% with walking and descending stairs     Baseline  75-80%    Status  Achieved      PT LONG TERM GOAL #3   Title  The patient will have improved gastroc muscle length to 8 degrees needed to descend steps reciprocally    Status   Achieved      PT LONG TERM GOAL #4   Title  The patient will be able to walk 1 mile with minimal ankle pain.     Status  Achieved      PT LONG TERM GOAL #5   Title  Right ankle/foot strength grossly 4-/5 needed for walking longer periods of time    Baseline  DF 4+/5, inversion 4+/5, eversion 4/5    Status  Achieved      PT LONG TERM GOAL #6   Title  FOTO functional outcome score improved from 52% limitation to 31%    Status  Achieved            Plan - 11/14/17 1141    Clinical Impression Statement  Pt is ready to D/C to HEP today.  Pt with improved Rt ankle A/ROM and strength.  Pt with functional strength deficits and has difficulty on unlevel surfaces and has not returned to hiking on unlevel surfaces. FOTO is improved to 28% limitation. Pt will D/C to HEP for further strength, flexibility and proprioception.       PT Next Visit Plan  D/C PT to HEP    PT Home Exercise Plan   Access Code: [email or web address]     Consulted and Agree with Plan of Care  Patient       Patient will benefit from skilled therapeutic intervention in order to improve the following deficits and impairments:     Visit Diagnosis: Pain in right ankle and joints of right foot  Stiffness of right ankle, not elsewhere classified  Muscle weakness (generalized)  Difficulty in walking, not elsewhere classified     Problem List Patient Active Problem List   Diagnosis Date Noted  . Closed nondisplaced fracture of right calcaneus with routine healing 08/03/2017  . Closed  nondisplaced fracture of right calcaneus 07/06/2017   PHYSICAL THERAPY DISCHARGE SUMMARY  Visits from Start of Care: 8  Current functional level related to goals / functional outcomes: See above for current status.     Remaining deficits: Pt with Rt ankle instability on unlevel surfaces and will continue to work on HEP for strength and stability.     Education / Equipment: HEP- issued green band for HEP Plan: Patient agrees to  discharge.  Patient goals were met. Patient is being discharged due to meeting the stated rehab goals.  ?????        Sigurd Sos, PT 11/14/17 11:47 AM  [facility] [address] [place], [place], [address] Phone: [phone]   Fax:  [phone]  Name: Lydia Toren MRN: [id number] Date of Birth: 08-12-1960

## 2019-04-17 IMAGING — CT CT FOOT*R* W/O CM
2 series · 11 of 27 positions shown, 14 images · non-contrast
Comparison: Radiographs 06/18/2017.

CLINICAL DATA: Fall from roof 06/17/2017.  Calcaneal fracture.

EXAM:
CT OF THE RIGHT FOOT WITHOUT CONTRAST
TECHNIQUE: Multidetector CT imaging of the right foot was performed according
to the standard protocol. Multiplanar CT image reconstructions were
also generated.

[Series 5: soft tissue lower extremity · axial · 0.52mm/px · z∈[-301,-181]mm · 6 of 80 slices shown, 8 images]
[im 13/80  soft-tissue]
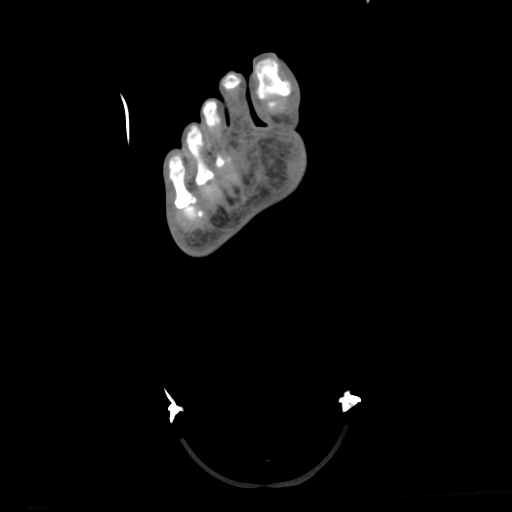
[im 13/80  bone]
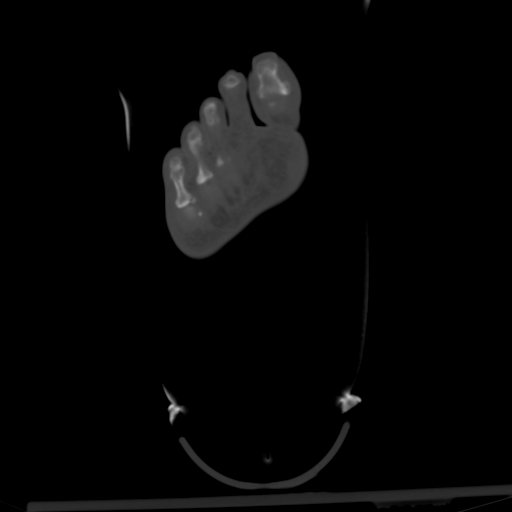
[im 25/80  bone]
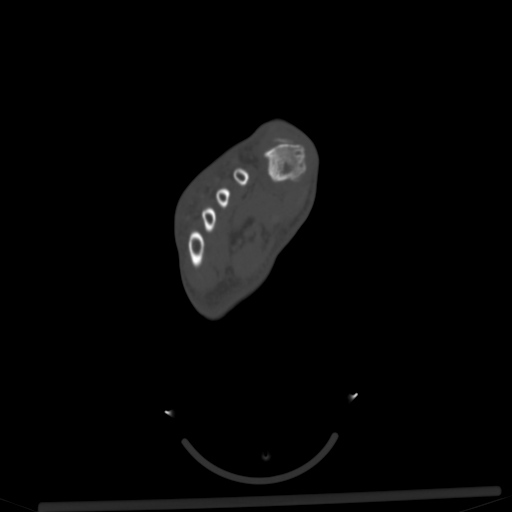
[im 37/80  bone]
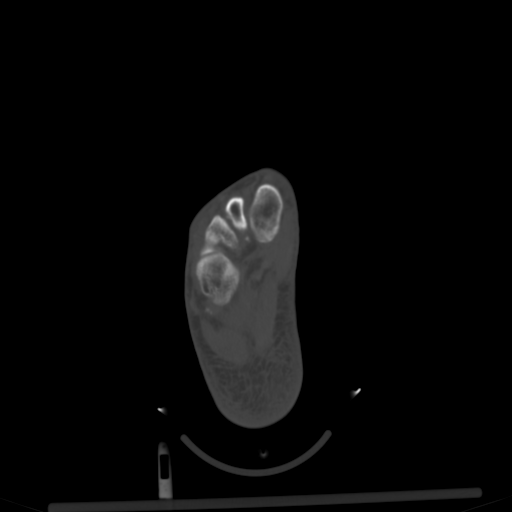
[im 49/80  bone]
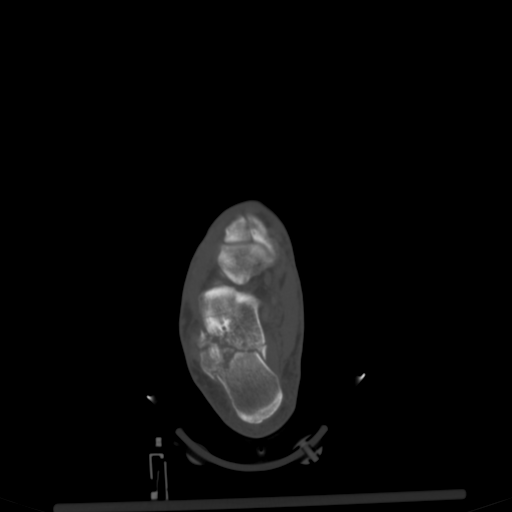
[im 61/80  soft-tissue]
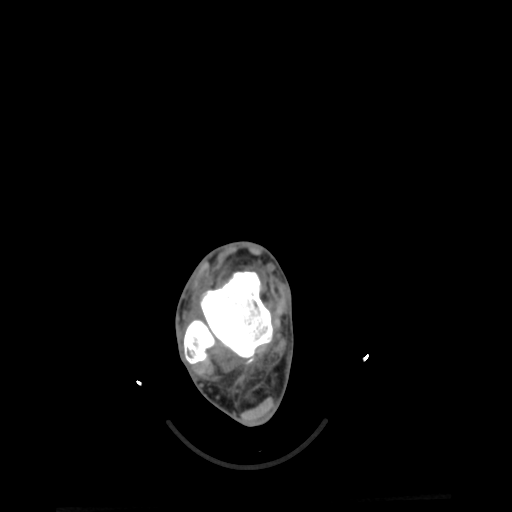
[im 61/80  bone]
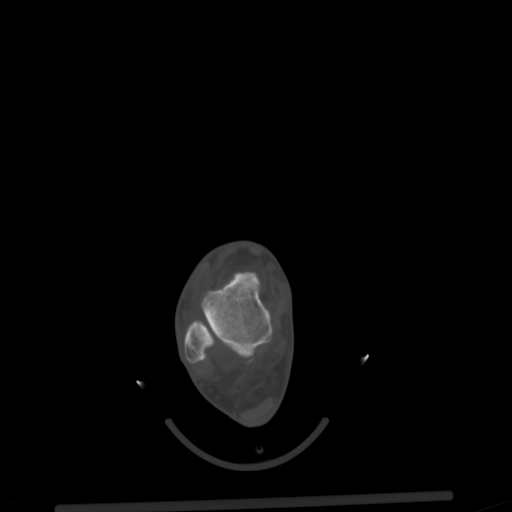
[im 73/80  bone]
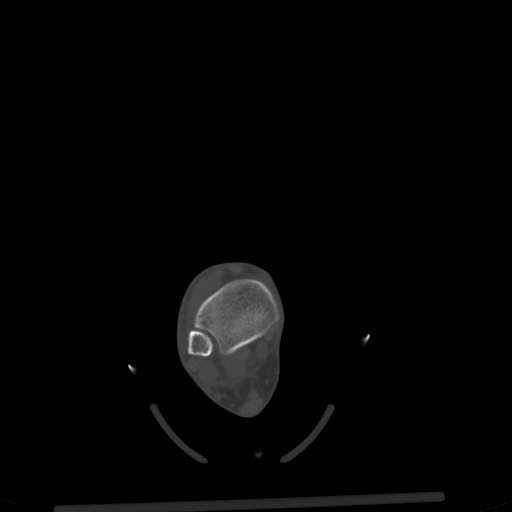

[Series 11: sagsoft tissue · sagittal · 0.22mm/px · 5 of 42 slices shown, 6 images]
[im 14/42  bone]
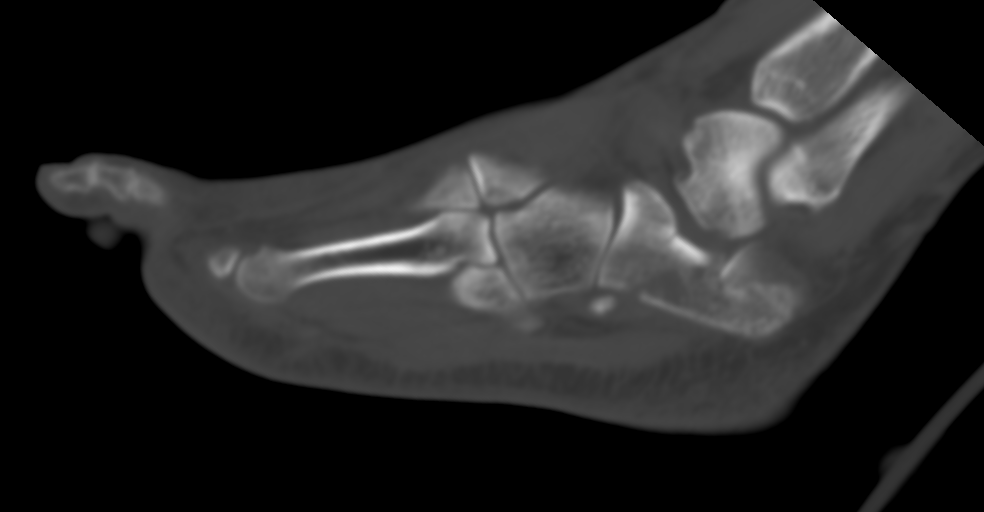
[im 18/42  bone]
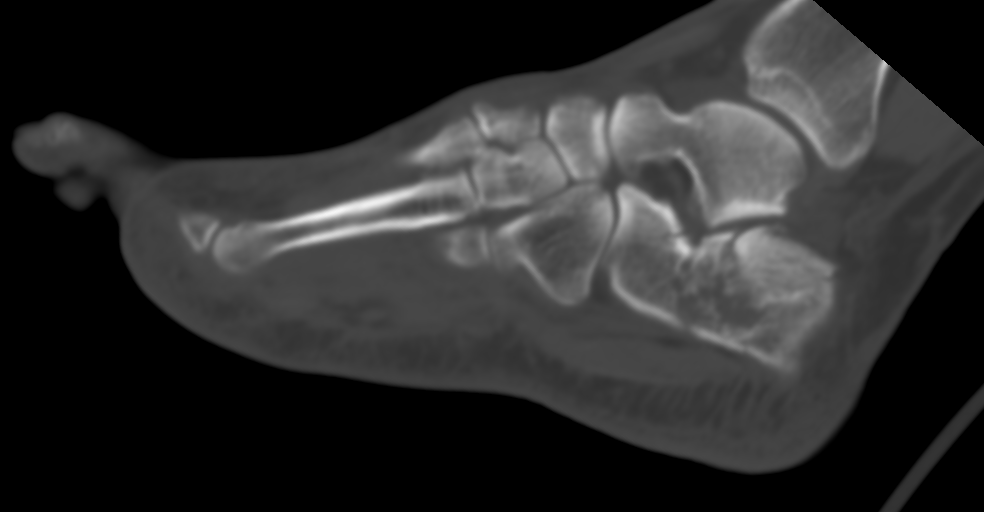
[im 21/42  soft-tissue]
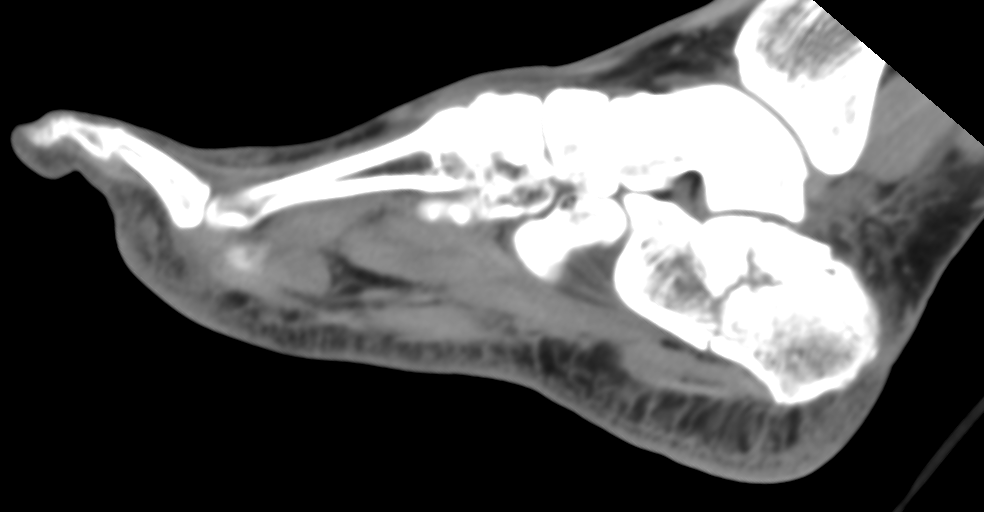
[im 21/42  bone]
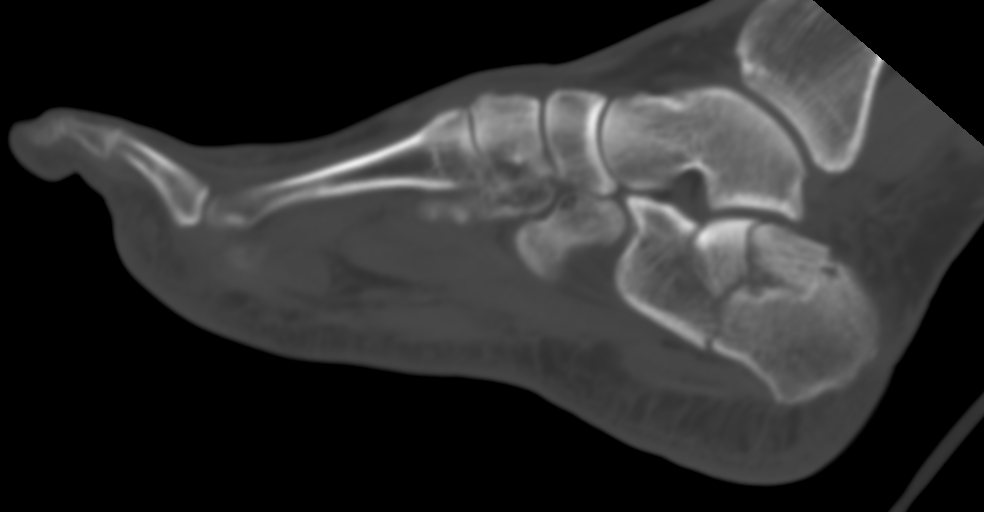
[im 24/42  bone]
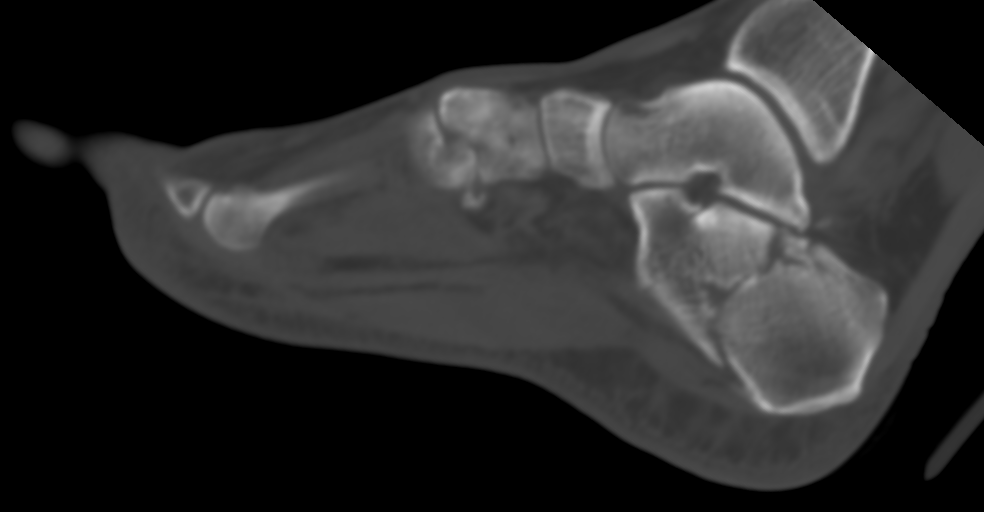
[im 28/42  bone]
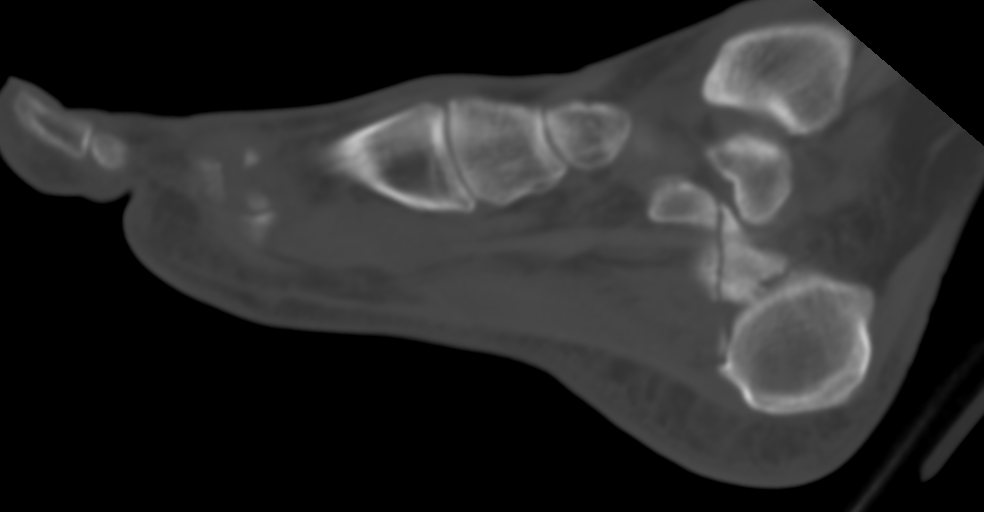

[11 of 27 positions shown; findings below may reference images not displayed]

FINDINGS: Bones/Joint/Cartilage

There is a comminuted, intra-articular compression fracture of the
calcaneus. This has a centrolateral compression configuration with
extension into the posterior facet of the subtalar joint. This
component is displaced by less than 2 mm. There is extension of a
nondisplaced fracture into the lateral aspect of the anterior
process, but no involvement the calcaneocuboid articulation. There
is mild displacement the medial and lateral calcaneal cortices (up
to 4 medially).

No other acute fractures are identified. There is mild fragmented
spurring at the Lisfranc joint, involving the 2nd metatarsal base
and medial cuneiform. The alignment is normal at the Lisfranc joint.
Moderate degenerative changes are present at the 1st
metatarsophalangeal joint.

Ligaments

Suboptimally assessed by CT.

Muscles and Tendons

The ankle tendons appear intact without evidence of entrapment by
the calcaneal fractures. Type 1 accessory navicular noted within the
substance of the distal posterior tibialis tendon. There is also a
small os peroneum. No significant muscular findings.

Soft tissues

Mild edema throughout the subcutaneous tissues of the hindfoot. No
focal hematoma.
IMPRESSION: 1. Comminuted, intra-articular compression fracture of the calcaneus
as described. The fracture is only minimally displaced but extends
into the posterior subtalar joint.
2. No other tarsal bone fractures identified.
3. Mild midfoot degenerative changes.
4. No evidence of ankle tendon disruption or entrapment.

## 2019-11-20 ENCOUNTER — Other Ambulatory Visit: Payer: Managed Care, Other (non HMO)

## 2019-11-20 DIAGNOSIS — Z20822 Contact with and (suspected) exposure to covid-19: Secondary | ICD-10-CM

## 2019-11-22 LAB — NOVEL CORONAVIRUS, NAA: SARS-CoV-2, NAA: NOT DETECTED

## 2019-11-22 LAB — SARS-COV-2, NAA 2 DAY TAT

## 2021-05-25 ENCOUNTER — Other Ambulatory Visit: Payer: Self-pay | Admitting: Obstetrics and Gynecology

## 2021-05-25 DIAGNOSIS — Z13820 Encounter for screening for osteoporosis: Secondary | ICD-10-CM

## 2021-05-25 DIAGNOSIS — Z8262 Family history of osteoporosis: Secondary | ICD-10-CM

## 2021-07-13 ENCOUNTER — Ambulatory Visit
Admission: RE | Admit: 2021-07-13 | Discharge: 2021-07-13 | Disposition: A | Discharge status: Home / Self Care | Payer: 59 | Source: Ambulatory Visit | Attending: Obstetrics and Gynecology | Admitting: Obstetrics and Gynecology

## 2021-07-13 DIAGNOSIS — Z13820 Encounter for screening for osteoporosis: Secondary | ICD-10-CM

## 2021-07-13 DIAGNOSIS — Z8262 Family history of osteoporosis: Secondary | ICD-10-CM
